# Patient Record
Sex: Male | Born: 1990 | Race: Black or African American | Hispanic: No | Marital: Single | Smoking: Never smoker
Health system: Southern US, Community
[De-identification: ages and names within clinical notes are randomized; demographics above are authoritative.]

## PROBLEM LIST (undated history)

## (undated) DIAGNOSIS — S069X9A Unspecified intracranial injury with loss of consciousness of unspecified duration, initial encounter: Secondary | ICD-10-CM

## (undated) DIAGNOSIS — G43909 Migraine, unspecified, not intractable, without status migrainosus: Secondary | ICD-10-CM

## (undated) HISTORY — PX: PEG TUBE PLACEMENT: SUR1034

## (undated) HISTORY — PX: BRONCHOSCOPY RIGID W/ PLACEMENT TRACHEAL / BRONCHIAL STENT: SUR167

## (undated) HISTORY — DX: Migraine, unspecified, not intractable, without status migrainosus: G43.909

---
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
## 2008-02-08 ENCOUNTER — Encounter
Admission: RE | Admit: 2008-02-08 | Discharge: 2008-03-15 | Payer: Self-pay | Admitting: Physical Medicine & Rehabilitation

## 2008-02-10 ENCOUNTER — Ambulatory Visit: Payer: Self-pay | Admitting: Physical Medicine & Rehabilitation

## 2008-03-15 ENCOUNTER — Ambulatory Visit: Payer: Self-pay | Admitting: Physical Medicine & Rehabilitation

## 2008-06-13 ENCOUNTER — Encounter
Admission: RE | Admit: 2008-06-13 | Discharge: 2008-09-11 | Payer: Self-pay | Admitting: Physical Medicine & Rehabilitation

## 2008-07-01 ENCOUNTER — Ambulatory Visit: Payer: Self-pay | Admitting: Physical Medicine & Rehabilitation
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
## 2009-01-06 ENCOUNTER — Encounter
Admission: RE | Admit: 2009-01-06 | Discharge: 2009-01-17 | Payer: Self-pay | Admitting: Physical Medicine & Rehabilitation

## 2009-01-11 ENCOUNTER — Ambulatory Visit: Payer: Self-pay | Admitting: Physical Medicine & Rehabilitation

## 2009-04-14 ENCOUNTER — Emergency Department (HOSPITAL_COMMUNITY): Admission: EM | Admit: 2009-04-14 | Discharge: 2009-04-14 | Payer: Self-pay | Admitting: Emergency Medicine

## 2009-04-21 ENCOUNTER — Emergency Department (HOSPITAL_COMMUNITY): Admission: EM | Admit: 2009-04-21 | Discharge: 2009-04-21 | Payer: Self-pay | Admitting: Emergency Medicine

## 2009-05-05 ENCOUNTER — Emergency Department (HOSPITAL_COMMUNITY): Admission: EM | Admit: 2009-05-05 | Discharge: 2009-05-05 | Payer: Self-pay | Admitting: Emergency Medicine

## 2010-09-02 ENCOUNTER — Encounter: Payer: Self-pay | Admitting: Physical Medicine & Rehabilitation

## 2010-12-25 NOTE — Assessment & Plan Note (Signed)
Dakota Pena is here in followup of his traumatic brain injury.  He finished  his therapy last month.  Over the last several weeks he notes increasing  depression. Family noted decreased drive and comments that he is  slower than what he was.  He denies pain today.  He is doing some  walking at home but no other specific exercises as shown by therapy.  He  is taking some classes in his school twice a week to include grammar,  [place], math, speech.  It sounds as if he is planning to go back to  school this fall.  Dakota Pena is with him today and does not know of any  specific plans that were in order for him in the fall.  I assume that  there are as he has received neuropsychological testing, but I am not  privy to the details.   REVIEW OF SYSTEMS:  Notable for the above.  He denies any pain or aches.  Full review is in the written Health and History section.   SOCIAL HISTORY:  The patient is single and lives with his father.   PHYSICAL EXAMINATION:  VITAL SIGNS:  Blood pressure 120/72, pulse 55,  respiratory rate 17.  He is saturating 98%on room air.  GENERAL:  The patient is pleasant and oriented x3 with extra time.  The  patient remains slow to shift between topics.  He had difficulty with  divided attention. His was able to sequence tasks and able to spell the  word world forward and backwards. He was able to add dates together to  come up with total date.  He had more difficulty when performing another  task, however.  He seems to have some insight to his deficits in  situations, but he remains flat and is slow to initiate overall.  The  patient walks with a steppage-type gait pattern on the left and has  difficulty clearing the right due to hyperextension at the left knee.  Strength is good in both lower extremities at 5/5, but he seems to  display decreased proprioception and coordination overall in the left  lower more than left upper extremity today.  Facial weakness is still  noticeable. Speech is slightly dysarthric but intelligible.  Voice  remains nasal and monotone.  HEART:  Regular.  CHEST:  Clear.  ABDOMEN: Soft and nontender.   IMPRESSION:  1. Severe traumatic brain injury.  2. Dysphagia.  3. Impaired vision.  4. Reactive depression.   PLAN:  1. The patient is still awaiting corrective glasses.  2. Will begin patient on Cymbalta 30 mg daily for the first week, then      60 mg thereafter.  3. Begin Concerta 18 mg daily for attention and arousal.  4. Continued schooling.  I do not see how he will be able to keep up      with mainstream classes in the fall.  I assume the school is      evaluating this and has an individual education plan set up for      him.  I have not received neuropsych testing results at this point.  5. Encourage regular exercises to improve his quality of gait.  We      discussed these exercises today.  6. I will see the patient back in about 1 month's time.      Ranelle Oyster, M.D.  Electronically Signed     ZTS/MedQ  D:  02/23/2007 11:41:09  T:  02/23/2007 17:09:47  Job #:  [id number] 

## 2010-12-25 NOTE — Assessment & Plan Note (Signed)
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  He did not  tolerate the Cymbalta as he felt more depressed as if he did not want to  go one anymore.  We stopped the Cymbalta and this improved.  He began  the trial of Concerta and did not notice any substantial benefit with  this.  His teachers had noticed that he may have been walking a bit  worse and wondered if this was secondary to the drug.  Today his school  support system is present including social work, Human resources officer, etc.  Mother is here today.  They have described a plan of patient going back  on a half day schedule with 2 classes amidst regular students.  He would  attend two 90 minute classes and take these in a typical block style  fashion apparently that they are using at this point.  They do have a  fallback plan as far as more one on one shorter length classes, etc.,  that they can assume if Mung is unable to function and tolerate in  this setting.   Herley denies any pain today.  He denies frank depression but I am not  completely sure that he is being open with me on that.  He is sleeping  fairly well.  He uses his walker for gait balance at home and abroad.   REVIEW OF SYSTEMS:  Is notable for the above.  Full review is in the  written health and history section.   SOCIAL HISTORY:  Patient is single living with his parents who are  separated.   PHYSICAL EXAMINATION:  Blood pressure 115/63, pulse 75, respiratory rate  18, O2 sat 100% on room air.  Patient is generally alert and appropriate.  His affect remains  generally flat.  He walks with a scissoring gait when he is not using  his walker.  He has fair attention and focus.  He continues to take a  long time to process and initiate tasks, verbiage, etc.  Strength  remains generally 5/5 although he has decreased proprioception and  coordination overall.  Speech is dysarthric and slow but volume is good  and he is intelligible. He continues to have some  facial weakness.  Voice is nasal in nature.  HEART: Regular.  CHEST: Clear.  ABDOMEN: Soft and nontender.   ASSESSMENT:  1. Severe traumatic brain injury.  2. Dysphagia.  3. Impaired vision.  4. Reactive depression.   PLAN:  1. Continue with current eyeglass wear as this seems to be working      well for him.  2. We will increase Concerta to 36 mg daily.  3. Spoke at length with his support team regarding his schooling.  I      am a little concerned with his ability to attend to a class in a      regular environment for 90 minutes.  This may be difficult.      Hopefully the Concerta increase will help him.  Certainly it is      good to hear that there are fallback options available for him.  4. I do not believe the Concerta is leading to any gait problems and      in fact I do not think that his gait is appreciably worse than what      it was last visit.  If his gait does worsen perceivably with      increasing this Concerta will certainly stop it.  5. We  will check CBC,CMET, cortisol, TSH and testosterone levels today      to follow up these as any source of his      cognitive slowing.  6. I will see Yasuo back in about 2 to 3 months' time.      Ranelle Oyster, M.D.  Electronically Signed     ZTS/MedQ  D:  03/25/2007 11:17:04  T:  03/26/2007 12:37:11  Job #:  [id number]

## 2010-12-25 NOTE — Assessment & Plan Note (Signed)
Dakota Pena is back regarding his brain injury.  We Botoxed his gastrocnemius  muscles on March 15, 2008.  He has had improvement in the muscle  flexibility of his ankle.  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  He is doing well with this.  He is working-out daily  with a trainer.  He denies pain.  Mood has been good.  Issues continue  to be related to volume of his speech and use of his right hand and  walking.  Dad has some concerns in these areas.   REVIEW OF SYSTEMS:  Notable for the above.  The patient denies  depression.  Sleeping well, eating well, etc.  Full review is in the  written health and history section.   SOCIAL HISTORY:  The patient is living with his parents and no problems  currently.   PHYSICAL EXAMINATION:  VITAL SIGNS:  Blood pressure is 125/71, pulse is  54, respiratory rate 18, and he is sating 100% on room air.  GENERAL:  The patient is pleasant, alert, and oriented x3.  Affect is  generally bright and appropriate.  Speech continues to be a bit  dysarthric and hypophonic but with cueing, he is better.  EXTREMITIES:  He has good strength in both upper and lower extremities  today.  He has a mild right pronator drift.  With walking, he tends to  scissor and walk with steppings to the right, but with cueing does very  well and this improves.  He uses a cane for balance.  NEUROLOGIC:  Cognitively, he is intact.  He has good insight and  awareness, etc.  He is generally much better than he was 6 months ago.   ASSESSMENT:  1. Traumatic brain injury.  2. Reactive depression.   PLAN:  We talked in depth today the issues regarding his phonation, use  of his right hand, and his walking.  I think all these areas should  improve with appropriate practice.  We talked about work on some aerobic  exercises and work on his breathing coordination to improve phonation.  He also will  benefit from using music and singing to improve his  phonation.  He needs to really apply use of his right hand as well and  not given to the left as easy.  Also we discussed the same type of thing  with his gait as far as practicing appropriate technique.  I think he  could benefit from walking in a pool or on a treadmill with a mirror to  get some visual feedback on his forearm.  Overall, I am very pleased  with Dakota Pena's progress.  I think, if he can work on these areas on a  continued basis, he should continue to improve.  I will see him back in  about 6 months for followup.      Ranelle Oyster, M.D.  Electronically Signed     ZTS/MedQ  D:  07/01/2008 14:07:13  T:  07/02/2008 02:30:00  Job #:  [id number]

## 2010-12-25 NOTE — Assessment & Plan Note (Signed)
Dakota Pena is back regarding his brain injury.  His mood has been much  improved over the last several weeks.  He saw Dr. Leonides Cave.  He felt that  his reaction was secondary to his grandmother's passing with no  underlying etiology.  He thought he possibly could benefit from  antidepressant, but considering his poor response in the past to these,  did not push that recommendation.  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  He had  braces put on a few days ago.  He denies pain today.   REVIEW OF SYSTEMS:  Notable for the above.  Full review is in the  written health and history section of the chart.   SOCIAL HISTORY:  Unchanged.  Lives with his mother and is going to  school now.   PHYSICAL EXAMINATION:  Blood pressure 128/57, pulse 69, respiratory rate  18.  He is satting 97% on room air.  Patient is pleasant, alert and  oriented x3.  The patient is much more upbeat in initiative today.  He  continues waling with a steppage gait pattern and occasional scissoring,  but seems to be doing a bit better despite wearing large work boots.  Strength is 5/5 with ongoing apraxia still more on the left than the  right.  Speech is slightly apraxic as well.  If he is weak anywhere, it  is perhaps in the left ankle dorsiflexor still with 4/5 strength.  No  tone is noted.  He had full easy passive range of motion throughout.  HEART:  Regular.  CHEST:  Clear.  ABDOMEN:  Soft, nontender.   ASSESSMENT:  1. Severe traumatic brain injury.  2. Reactive decrease in mood.   PLAN:  1. Continue with physical therapy to improve the quality of his gait.      He will need to work on this for some time after completing therapy      as well.  I asked him to focus on the quality rather than quantity      of his gait.  He might benefit from pool ambulation as well as      treadmill walking down the line.  2. I will see Joaopedro back in four months' time.  I am  pleased how he      has progressed since February.      Ranelle Oyster, M.D.  Electronically Signed     ZTS/MedQ  D:  10/21/2007 12:52:10  T:  10/21/2007 18:41:17  Job #:  [id number]
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
## 2010-12-25 NOTE — Procedures (Signed)
NAMEROSHAD, HACK NO.:  [id number]   MEDICAL RECORD NO.:  [id number]         PATIENT TYPE:  HREC   LOCATION:                                 FACILITY:   PHYSICIAN:  Ranelle Oyster, M.D.DATE OF BIRTH:  1991/03/02   DATE OF PROCEDURE:  DATE OF DISCHARGE:                               OPERATIVE REPORT   PROCEDURE:  Botox injection, diagnostic code 72.12.   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  We sought the highest areas of activity and a  central muscle belly.  After localization, we injected in each head with  100 units of botulinum toxin type A diluted in 1 mL preservative-free  normal saline.  The patient tolerated well.   Encouraged activity, stretching range of motion as he has been doing at  home.  I will see him back in 3 months.      Ranelle Oyster, M.D.  Electronically Signed     ZTS/MEDQ  D:  03/15/2008 13:11:32  T:  03/16/2008 05:09:13  Job:  [id number]
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
## 2010-12-25 NOTE — Assessment & Plan Note (Signed)
HISTORY OF PRESENT ILLNESS:  Dakota Pena is back regarding his brain injury.  He had been doing well over the last couple of months.  He graduated  from high school and walked with his class.  He signed up for classes at  [employer] for the fall.  He will take 1 or 2 courses.  He still is having difficulties with his walking.  He uses his AFO.  He  is up and about.  His cane is therefore balanced.  He is able to dress  and bathe on his own.  He is still with his parents to provide  supervision.  Mood has been generally improved.  He denies flank pain,  although he notes some popping in the right shoulder while swinging at  goal club this weekend.   REVIEW OF SYSTEMS:  Notable for tremor, trouble walking, occasional  confusion.  His full review is in the written health history section of  the chart.   SOCIAL HISTORY:  As noted above.  He lives with his mother.   PHYSICAL EXAMINATION:  VITAL SIGNS:  Blood pressure 107/51, pulse is 50,  respiratory rate 20, and sating 98% room air.  GENERAL:  The patient is pleasant, alert and oriented x3.  Affect is  generally bright today.  He is talking more than I have ever heard him  talk before with excellent language skills and better voice and flexion.  He makes more eye contact today.  Movement still are slow and apraxic.  When he walks with left foot, the toes tend to drag, and he hyperextends  the knee in weightbearing.  Brace seems to be fitting appropriately over  the leg.  In checking his range of motion, he has definite tightness in  the left heel cord with spasticity to gastrocnemius.  There is also some  trace spasticity at the hamstrings.  Right shoulder is a bit tight with  abduction.  He has a hard time getting him pass 90 degrees with  tightness.  He seemed to tight along the glenohumeral junction.  No pain  was produced.  He popped one time, but really not again.   ASSESSMENT:  1. Severe traumatic brain injury.  2.  Reactive depression.   PLAN:  1. I would like to perform Botox to the left gastrocnemius/soleus      muscles using 200 units.  2. Improve his ankle range of motion to look at placing a lift in the      left heel to prevent knee hyperextension.  3. Recommended range of motion for right shoulder at home with      different exercises we described in the office today.  4. I wrote Dakota Pena a letter for [employer] as well as the [employer] stating his deficits and current diagnosis.      Ranelle Oyster, M.D.  Electronically Signed     ZTS/MedQ  D:  02/10/2008 13:20:44  T:  02/11/2008 05:17:08  Job #:  [id number]
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
## 2010-12-28 NOTE — Assessment & Plan Note (Signed)
HISTORY OF PRESENT ILLNESS:  Dakota Pena is here in followup of his traumatic  brain injury.  He was in rehab until January 7 and now is at home  receiving therapy and home bound schooling twice a week.  His therapies  are actually at [facility].  Dakota Pena has been working  on balance and gait as well as coordination.  He is walking on a  treadmill.  His balance he notes is improving, particularly when he uses  the wide gait mechanism.  His biggest complaint today is decreased  vision, both for near and far objects, but seem to worsen during the day  as he grows fatigued.  He denies any frank double vision.  Family notes  occasional twitching of the eyeball.  Dakota Pena has only had one headaches  and does not note any headaches with the decreased vision.  Right  shoulder has been better except for when he has ranged to extreme  levels.  He has been sleeping well.  Mood has been good.  Family states  he is a bit more outspoken, but not impulsive or agitated.  All in all,  Dakota Pena has been doing quite well despite a lot of the notoriety he has  gained in the media over this incident.   REVIEW OF SYSTEMS:  Notable for the above.  The patient does have  occasional tremor, particularly in the right side.  Denies any other  Constitutional, GI, GU, Cardiorespiratory complaints.   SOCIAL HISTORY:  The patient is living with his father who provides the  majority of his care.  His mother also assists.   PHYSICAL EXAMINATION:  VITAL SIGNS:  Blood pressure 120/72, pulse 53,  respirations 16, saturating 99% on room air.  GENERAL:  The patient is pleasant, no acute distress.  He is alert and  oriented x3.  Affect is bright and generally appropriate.  HEART:  Regular. C  CHEST:  Clear.  ABDOMEN:  Soft, nontender.  NEUROLOGICAL:  Dakota Pena's speech is still nasal and somewhat muffled in  quality, but he is able to convey his thoughts and ideas.  He is talking  in longer sentences and phrases  than he had previously.  He is able to  follow one step commands without difficulty.  Insight and awareness is  actually very good.  He occasionally needed help from his mom and dad to  finish thoughts, although, often he could really finish a thought on his  own, but seemed to want to fall back on assistance by them to complete a  sentence.  Motor function is gradually improving.  His strength is  essentially 5/5 on the left and 4-4+ on the right.  The right upper  extremity still seems to be the most affected.  His fluidity has  improved however significantly there.  Was able to really lift in range  is arm and shoulder to a near full range of motion today.  Dakota Pena stood  for me today with little assistance.  The right knee tended to  hyperextend when he stood, but he was able to correct.  He needed some  queuing for standing straight, but his posture all in all was excellent,  and once he was standing, his only supervision to gentle contact guard  for support.  No tone was appreciated in the legs or arms today.  Sensation was normal.  Cranial nerve exam was generally unremarkable.  I  saw no double vision or definite disconjugate movement of the eyes  today.  The patient had two areas of redness and discoloration of the  interphalangeal joints of the third and fourth toes of the left foot.  There were no signs of fracture.   ASSESSMENT:  1. Status post severe traumatic brain injury with diffuse axonal-type      mechanism.  2. Dysphagia which has resolved.  3. Decreased vision since brain injury.   PLAN:  1. Continue with outpatient therapy.  The patient is doing quite well      at this point, but he needs to work on the fluidity of his      movement.  I would focus on repetition rather than weight and      resistance at this time.  2. Will make a referral to Dr. Aura Camps for formal visual      evaluation in the setting of his traumatic brain injury.  He is now      over  four months out from the injury itself.  3. Continue educational efforts at home, although, I do not think      forcing him along with school at this point is the best option.      This will come along on its own.  It certainly does not hurt to      introduce these areas to help push him.  However, I do not want he      and the family to be unrealistic about expectations here in the      short term.  4. I will see [name] back in about 2-3 months time.      Ranelle Oyster, M.D.  Electronically Signed     ZTS/MedQ  D:  09/15/2006 12:55:35  T:  09/15/2006 13:29:28  Job #:  [id number]   cc:   Ramon Dredge L. Juanetta Gosling, M.D.  Fax: [phone]
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
## 2010-12-28 NOTE — Assessment & Plan Note (Signed)
The patient is back regarding his TBI. He is working on outpatient  therapies. He has been discharged from speech therapy. He is walking  without a device at home and uses a walker outside the house. He is  still having some difficulties with fluidity of his movement but it is  generally improving. He is getting home schooling 6-7 hours a week,  essentially working on Doctor, general practice and math. He is receiving some more  neuro-psych testing with plans for further testing at the end of the  summer with goal of potentially returning to school. He is sleeping  well. He is eating well. He denies any mood problems. He does singing  and musical work with his dad. He told me he played the drums recently.  He is on no medications currently.   REVIEW OF SYSTEMS:  Negative other than those issues mentioned above.  Full review is in the written medical history section.   SOCIAL HISTORY:  The patient is single living with his parents.   PHYSICAL EXAM:  Blood pressure is 116/71, pulse is 49, respiratory is  16, he is sating 100% on room air. The patient walked to me without a  walker today and had slightly wide-based. He still has some difficulty  coordinating the legs, particularly the right side and is still somewhat  disjointed in movements but is very stable. He is able to change  directions without issues. He is able to compensate for obstacles around  him. He has ability to touch his nose and ear with the right thumb. He  can touch his left knee cap with his right heel with extra time.  Strength is generally near 4+-5/5 throughout. Sensory exam was intact.  Voice was slightly monotone and nasal but he is able to clearly  pronounce words. Heart was regular. Chest is clear. Abdomen is soft,  nontender.   ASSESSMENT:  1. Traumatic brain injury, diffuse zygomal type mechanism.  2. Dysphasia.  3. Impaired vision.   PLAN:  1. The patient is awaiting corrective lenses per Dr. Karleen Hampshire.  2.  Continue with exercise program. I would also work on his school      skills as much as possible. He may benefit from a stimulant to      improve attention and short term memory which are his biggest      issues. I am not sure he is able to keep up with the speed of a      main-stream class at this point. I will re-evaluate that in the      summer when I see him back. I would appreciate neuro-psych testing      results if possible.      Ranelle Oyster, M.D.  Electronically Signed     ZTS/MedQ  D:  12/01/2006 13:59:01  T:  12/01/2006 14:26:15  Job #:  [id number]   cc:   Ramon Dredge L. Juanetta Gosling, M.D.  Fax: [phone]

## 2011-01-01 ENCOUNTER — Emergency Department (HOSPITAL_COMMUNITY): Payer: Medicaid Other

## 2011-01-01 ENCOUNTER — Emergency Department (HOSPITAL_COMMUNITY)
Admission: EM | Admit: 2011-01-01 | Discharge: 2011-01-01 | Disposition: A | Discharge status: Home / Self Care | Payer: Medicaid Other | Attending: Emergency Medicine | Admitting: Emergency Medicine

## 2011-01-01 DIAGNOSIS — M7989 Other specified soft tissue disorders: Secondary | ICD-10-CM | POA: Insufficient documentation

## 2012-01-14 LAB — LIPID PANEL
Cholesterol: 184 (ref 0–200)
LDL Cholesterol: 114
Triglycerides: 38 — AB (ref 40–160)

## 2013-01-13 ENCOUNTER — Other Ambulatory Visit (HOSPITAL_COMMUNITY): Payer: Self-pay | Admitting: Pulmonary Disease

## 2013-01-13 ENCOUNTER — Ambulatory Visit (HOSPITAL_COMMUNITY)
Admission: RE | Admit: 2013-01-13 | Discharge: 2013-01-13 | Disposition: A | Discharge status: Home / Self Care | Payer: Medicaid Other | Source: Ambulatory Visit | Attending: Pulmonary Disease | Admitting: Pulmonary Disease

## 2013-01-13 DIAGNOSIS — R52 Pain, unspecified: Secondary | ICD-10-CM

## 2013-01-13 DIAGNOSIS — M25519 Pain in unspecified shoulder: Secondary | ICD-10-CM | POA: Insufficient documentation

## 2013-03-13 ENCOUNTER — Emergency Department (HOSPITAL_COMMUNITY)
Admission: EM | Admit: 2013-03-13 | Discharge: 2013-03-13 | Disposition: A | Discharge status: Home / Self Care | Payer: Medicaid Other | Attending: Emergency Medicine | Admitting: Emergency Medicine

## 2013-03-13 ENCOUNTER — Encounter (HOSPITAL_COMMUNITY): Payer: Self-pay | Admitting: Emergency Medicine

## 2013-03-13 DIAGNOSIS — Z87828 Personal history of other (healed) physical injury and trauma: Secondary | ICD-10-CM | POA: Insufficient documentation

## 2013-03-13 DIAGNOSIS — I498 Other specified cardiac arrhythmias: Secondary | ICD-10-CM | POA: Insufficient documentation

## 2013-03-13 DIAGNOSIS — L259 Unspecified contact dermatitis, unspecified cause: Secondary | ICD-10-CM | POA: Insufficient documentation

## 2013-03-13 HISTORY — DX: Unspecified intracranial injury with loss of consciousness of unspecified duration, initial encounter: S06.9X9A

## 2013-03-13 MED ORDER — PREDNISONE 10 MG PO TABS: ORAL_TABLET | ORAL | Status: DC

## 2013-03-13 MED ORDER — HYDROCODONE-ACETAMINOPHEN 5-325 MG PO TABS
2.0000 | ORAL_TABLET | Freq: Once | ORAL | Status: AC
  Administered 2013-03-13: 2 via ORAL
  Filled 2013-03-13: qty 2

## 2013-03-13 MED ORDER — CEPHALEXIN 500 MG PO CAPS
500.0000 mg | ORAL_CAPSULE | Freq: Once | ORAL | Status: AC
  Administered 2013-03-13: 500 mg via ORAL
  Filled 2013-03-13: qty 1

## 2013-03-13 MED ORDER — MUPIROCIN CALCIUM 2 % EX CREA: TOPICAL_CREAM | Freq: Three times a day (TID) | CUTANEOUS | Status: DC

## 2013-03-13 MED ORDER — HYDROCODONE-ACETAMINOPHEN 5-325 MG PO TABS: 1.0000 | ORAL_TABLET | ORAL | Status: DC | PRN

## 2013-03-13 MED ORDER — PREDNISONE 50 MG PO TABS
60.0000 mg | ORAL_TABLET | Freq: Once | ORAL | Status: AC
  Administered 2013-03-13: 60 mg via ORAL
  Filled 2013-03-13: qty 1

## 2013-03-13 NOTE — ED Provider Notes (Signed)
Medical screening examination/treatment/procedure(s) were performed by non-physician practitioner and as supervising physician I was immediately available for consultation/collaboration.   Hurman Horn, MD 03/13/13 [phone]

## 2013-03-13 NOTE — ED Notes (Signed)
Pt c/o painful rash to left axilla x 1 week.

## 2013-03-13 NOTE — ED Provider Notes (Signed)
CSN: [id number]     Arrival date & time 03/13/13  0902 History     First MD Initiated Contact with Patient 03/13/13 [phone]     Chief Complaint  Patient presents with  . Rash   (Consider location/radiation/quality/duration/timing/severity/associated sxs/prior Treatment) HPI Comments: Patient presents to the emergency department with complaint of rash on the right and left axilla. Patient states this started approximately 1-1-1/2 weeks ago. Patient states he has" very sensitive skin" and has recently tried a different deodorant. Shortly after using his deodorant he noted painful sores under the arms. He then developed blisters which gradually ruptured, he states he noticed a change in the color of his arm, and this was accompanied by a great deal of pain. He's not had any puslike drainage from the areas under the arm. He's not had any nausea vomiting. The patient denies having any immune compromising situations.  The history is provided by the patient.    Past Medical History  Diagnosis Date  . TBI (traumatic brain injury)    History reviewed. No pertinent past surgical history. No family history on file. History  Substance Use Topics  . Smoking status: Never Smoker   . Smokeless tobacco: Not on file  . Alcohol Use: No    Review of Systems  Constitutional: Negative for activity change.       All ROS Neg except as noted in HPI  HENT: Negative for nosebleeds and neck pain.   Eyes: Negative for photophobia and discharge.  Respiratory: Negative for cough, shortness of breath and wheezing.   Cardiovascular: Negative for chest pain and palpitations.  Gastrointestinal: Negative for abdominal pain and blood in stool.  Genitourinary: Negative for dysuria, frequency and hematuria.  Musculoskeletal: Negative for back pain and arthralgias.  Skin: Positive for rash.  Neurological: Negative for dizziness, seizures and speech difficulty.  Psychiatric/Behavioral: Negative for hallucinations and  confusion.    Allergies  Review of patient's allergies indicates no known allergies.  Home Medications  No current outpatient prescriptions on file. BP 131/88  Pulse 50  Temp(Src) 99 F (37.2 C)  Resp 18  Ht 6\' 3"  (1.905 m)  Wt 190 lb (86.183 kg)  BMI 23.75 kg/m2  SpO2 99% Physical Exam  Nursing note and vitals reviewed. Constitutional: He is oriented to person, place, and time. He appears well-developed and well-nourished.  Non-toxic appearance.  HENT:  Head: Normocephalic.  Right Ear: Tympanic membrane and external ear normal.  Left Ear: Tympanic membrane and external ear normal.  Eyes: EOM and lids are normal. Pupils are equal, round, and reactive to light.  Neck: Normal range of motion. Neck supple. Carotid bruit is not present.  Cardiovascular: Regular rhythm, normal heart sounds, intact distal pulses and normal pulses.  Bradycardia present.   Pulmonary/Chest: Breath sounds normal. No respiratory distress.  Abdominal: Soft. Bowel sounds are normal. There is no tenderness. There is no guarding.  Musculoskeletal: Normal range of motion.  Lymphadenopathy:       Head (right side): No submandibular adenopathy present.       Head (left side): No submandibular adenopathy present.    He has no cervical adenopathy.  Neurological: He is alert and oriented to person, place, and time. He has normal strength. No cranial nerve deficit or sensory deficit.  Skin: Skin is warm and dry. Rash noted.  There multiple ruptured blisters under the right and left axilla. There is hyperpigmentation of the area of both axilla areas. There is some drying and scaling areas under the axilla.  There no red streaking appreciated. There is no drainage at this time. Is no palpable lymphadenopathy appreciated.  Psychiatric: He has a normal mood and affect. His speech is normal.    ED Course   Procedures (including critical care time)  Labs Reviewed - No data to display No results found. No diagnosis  found.  MDM  *I have reviewed nursing notes, vital signs, and all appropriate lab and imaging results for this patient.** Patient has started a different deodorant. He now has multiple ruptured blister areas under the arm on the right and left. He also has pain and discomfort. There is no red streaking appreciated. There is no active drainage at this time. The area is tender to palpation.  Patient advised to stop the deodorant. To try hypoallergenic deodorants. Prescription for Bactroban, prednisone, and Norco given to the patient.  Kathie Dike, PA-C 03/13/13 1050

## 2018-09-25 ENCOUNTER — Emergency Department (HOSPITAL_COMMUNITY): Payer: Medicaid Other

## 2018-09-25 ENCOUNTER — Other Ambulatory Visit: Payer: Self-pay

## 2018-09-25 ENCOUNTER — Emergency Department (HOSPITAL_COMMUNITY)
Admission: EM | Admit: 2018-09-25 | Discharge: 2018-09-25 | Disposition: A | Discharge status: Home / Self Care | Payer: Medicaid Other | Attending: Emergency Medicine | Admitting: Emergency Medicine

## 2018-09-25 ENCOUNTER — Encounter (HOSPITAL_COMMUNITY): Payer: Self-pay | Admitting: Emergency Medicine

## 2018-09-25 DIAGNOSIS — Z79899 Other long term (current) drug therapy: Secondary | ICD-10-CM | POA: Insufficient documentation

## 2018-09-25 DIAGNOSIS — R1012 Left upper quadrant pain: Secondary | ICD-10-CM | POA: Diagnosis present

## 2018-09-25 DIAGNOSIS — J209 Acute bronchitis, unspecified: Secondary | ICD-10-CM | POA: Insufficient documentation

## 2018-09-25 DIAGNOSIS — J40 Bronchitis, not specified as acute or chronic: Secondary | ICD-10-CM

## 2018-09-25 MED ORDER — BENZONATATE 200 MG PO CAPS: 200.0000 mg | ORAL_CAPSULE | Freq: Three times a day (TID) | ORAL | 0 refills | Status: DC | PRN

## 2018-09-25 MED ORDER — AZITHROMYCIN 250 MG PO TABS
ORAL_TABLET | ORAL | 0 refills | Status: DC
Start: 2018-09-25 — End: 2019-05-11

## 2018-09-25 MED ORDER — IBUPROFEN 800 MG PO TABS
800.0000 mg | ORAL_TABLET | Freq: Once | ORAL | Status: AC
  Administered 2018-09-25: 800 mg via ORAL
  Filled 2018-09-25: qty 1

## 2018-09-25 NOTE — ED Notes (Signed)
Pt reports working out several days ago and had back pain   Has come here for eval   Denies incontinence   Ambulates heel to toe

## 2018-09-25 NOTE — ED Notes (Signed)
TT in to assess 

## 2018-09-25 NOTE — ED Triage Notes (Signed)
Pain in upper RT side of back when breathing for a few days.  Reports having a cold for a week or 2 with a lot of coughing.

## 2018-09-25 NOTE — ED Provider Notes (Signed)
[facility] EMERGENCY DEPARTMENT Provider Note   CSN: [id number] Arrival date & time: 09/25/18  1508     History   Chief Complaint Chief Complaint  Patient presents with  . Back Pain    HPI Dakota Pena is a 28 y.o. male.  HPI  Dakota Pena is a 28 y.o. male who presents to the Emergency Department complaining of left upper back pain, cough, chills, and intermittent fever.  Symptoms have been present for greater than 1 week.  He states the cough has been excessive at times and occasionally productive.  He notices pain to his left upper back with coughing, deep breathing and sneezing.  He does not have pain with movement of his left arm or with palpation.  He also states that he was working out with weights recently and thought maybe he may have injured his upper back.  He denies shortness of breath and chest pain.  No sore throat, abdominal pain, or vomiting.  He has not tried any medication for symptomatic relief.    Past Medical History:  Diagnosis Date  . TBI (traumatic brain injury) (HCC)     There are no active problems to display for this patient.   History reviewed. No pertinent surgical history.    Home Medications    Prior to Admission medications   Medication Sig Start Date End Date Taking? Authorizing Provider  HYDROcodone-acetaminophen (NORCO/VICODIN) 5-325 MG per tablet Take 1 tablet by mouth every 4 (four) hours as needed for pain. 03/13/13   Ivery Quale, PA-C  mupirocin cream (BACTROBAN) 2 % Apply topically 3 (three) times daily. 03/13/13   Ivery Quale, PA-C  predniSONE (DELTASONE) 10 MG tablet 3,9,0,3,0 - take daily with food starting Aug. 3, 2014 03/13/13   Ivery Quale, PA-C    Family History No family history on file.  Social History Social History   Tobacco Use  . Smoking status: Never Smoker  . Smokeless tobacco: Never Used  Substance Use Topics  . Alcohol use: No  . Drug use: No     Allergies   Patient has no known  allergies.   Review of Systems Review of Systems  Constitutional: Positive for chills and fever. Negative for appetite change.  HENT: Positive for congestion. Negative for sore throat and trouble swallowing.   Respiratory: Positive for cough and chest tightness. Negative for shortness of breath and wheezing.   Cardiovascular: Negative for chest pain and leg swelling.  Gastrointestinal: Negative for abdominal pain, nausea and vomiting.  Genitourinary: Negative for decreased urine volume and dysuria.  Musculoskeletal: Positive for back pain (Left upper back pain). Negative for arthralgias.  Skin: Negative for rash.  Neurological: Negative for dizziness, weakness, numbness and headaches.  Hematological: Negative for adenopathy.     Physical Exam Updated Vital Signs BP 128/85 (BP Location: Right Arm)   Pulse 93   Temp 99.9 F (37.7 C) (Oral)   Resp 18   Ht 6\' 3"  (1.905 m)   Wt 81.6 kg   SpO2 97%   BMI 22.50 kg/m   Physical Exam Vitals signs and nursing note reviewed.  Constitutional:      Appearance: Normal appearance.  HENT:     Head: Normocephalic.     Right Ear: Tympanic membrane and ear canal normal.     Left Ear: Tympanic membrane and ear canal normal.     Mouth/Throat:     Mouth: Mucous membranes are moist.     Pharynx: Oropharynx is clear. No oropharyngeal exudate or  posterior oropharyngeal erythema.  Cardiovascular:     Rate and Rhythm: Normal rate and regular rhythm.     Pulses: Normal pulses.  Pulmonary:     Effort: Pulmonary effort is normal.     Breath sounds: Normal breath sounds.  Abdominal:     General: There is no distension.     Palpations: Abdomen is soft.     Tenderness: There is no abdominal tenderness.  Musculoskeletal: Normal range of motion.        General: No swelling or deformity.     Comments: Pt points to left scapular border as area of pain.  No tenderness to palpation, no edema.  No pain on ROM of the left arm.    Lymphadenopathy:      Cervical: No cervical adenopathy.  Skin:    General: Skin is warm.     Capillary Refill: Capillary refill takes less than 2 seconds.     Findings: No rash.  Neurological:     Mental Status: He is alert. Mental status is at baseline.      ED Treatments / Results  Labs (all labs ordered are listed, but only abnormal results are displayed) Labs Reviewed - No data to display  EKG None  Radiology No results found.  Procedures Procedures (including critical care time)  Medications Ordered in ED Medications  ibuprofen (ADVIL,MOTRIN) tablet 800 mg (800 mg Oral Given 09/25/18 1613)     Initial Impression / Assessment and Plan / ED Course  I have reviewed the triage vital signs and the nursing notes.  Pertinent labs & imaging results that were available during my care of the patient were reviewed by me and considered in my medical decision making (see chart for details).      Pt is well appearing.  Vitals are reassuring.  No tachycardia, hypoxia or dyspnea. CXR is neg for PNA.  Clinically, I doubt PE or dissection.    Pt has had sx's for greater than one week, so I feel that a course of antibiotics would be reasonable. Pt agrees to tx plan and close out pt f/u.  Return precautions discussed.    Final Clinical Impressions(s) / ED Diagnoses   Final diagnoses:  Bronchitis    ED Discharge Orders    None       Pauline Aus, PA-C 09/25/18 1706    Bethann Berkshire, MD 09/28/18 1510

## 2018-09-25 NOTE — Discharge Instructions (Addendum)
Drink plenty of fluids.  Take tylenol or ibuprofen if needed for pain or fever.  Take the antibiotic as directed until its finished.  Follow-up with your doctor for recheck or return to ER for any worsening symptoms

## 2018-09-25 NOTE — ED Notes (Signed)
From Rad 

## 2019-04-14 ENCOUNTER — Other Ambulatory Visit (HOSPITAL_COMMUNITY)
Admission: RE | Admit: 2019-04-14 | Discharge: 2019-04-14 | Disposition: A | Discharge status: Home / Self Care | Payer: Medicaid Other | Source: Ambulatory Visit | Attending: Pulmonary Disease | Admitting: Pulmonary Disease

## 2019-04-14 ENCOUNTER — Other Ambulatory Visit: Payer: Self-pay

## 2019-04-14 DIAGNOSIS — M25562 Pain in left knee: Secondary | ICD-10-CM | POA: Insufficient documentation

## 2019-04-14 DIAGNOSIS — Z0000 Encounter for general adult medical examination without abnormal findings: Secondary | ICD-10-CM | POA: Insufficient documentation

## 2019-04-14 DIAGNOSIS — M79672 Pain in left foot: Secondary | ICD-10-CM | POA: Diagnosis present

## 2019-04-14 DIAGNOSIS — R202 Paresthesia of skin: Secondary | ICD-10-CM | POA: Insufficient documentation

## 2019-04-14 DIAGNOSIS — G8194 Hemiplegia, unspecified affecting left nondominant side: Secondary | ICD-10-CM | POA: Diagnosis present

## 2019-04-14 DIAGNOSIS — F0781 Postconcussional syndrome: Secondary | ICD-10-CM | POA: Insufficient documentation

## 2019-04-14 DIAGNOSIS — Z87820 Personal history of traumatic brain injury: Secondary | ICD-10-CM | POA: Diagnosis not present

## 2019-04-14 LAB — TSH: TSH: 1.289 u[IU]/mL (ref 0.350–4.500)

## 2019-04-14 LAB — CBC WITH DIFFERENTIAL/PLATELET
Abs Immature Granulocytes: 0.01 10*3/uL (ref 0.00–0.07)
Basophils Absolute: 0 10*3/uL (ref 0.0–0.1)
Basophils Relative: 0 %
Eosinophils Absolute: 0.5 10*3/uL (ref 0.0–0.5)
Eosinophils Relative: 9 %
HCT: 42.5 % (ref 39.0–52.0)
Hemoglobin: 13.4 g/dL (ref 13.0–17.0)
Immature Granulocytes: 0 %
Lymphocytes Relative: 26 %
Lymphs Abs: 1.4 10*3/uL (ref 0.7–4.0)
MCH: 26.2 pg (ref 26.0–34.0)
MCHC: 31.5 g/dL (ref 30.0–36.0)
MCV: 83.2 fL (ref 80.0–100.0)
Monocytes Absolute: 0.4 10*3/uL (ref 0.1–1.0)
Monocytes Relative: 8 %
Neutro Abs: 3.2 10*3/uL (ref 1.7–7.7)
Neutrophils Relative %: 57 %
Platelets: 256 10*3/uL (ref 150–400)
RBC: 5.11 MIL/uL (ref 4.22–5.81)
RDW: 14.5 % (ref 11.5–15.5)
WBC: 5.6 10*3/uL (ref 4.0–10.5)
nRBC: 0 % (ref 0.0–0.2)

## 2019-04-14 LAB — LIPID PANEL
Cholesterol: 181 mg/dL (ref 0–200)
HDL: 68 mg/dL (ref >40)
LDL Cholesterol: 107 mg/dL — ABNORMAL HIGH (ref 0–99)
Total CHOL/HDL Ratio: 2.7 RATIO
Triglycerides: 32 mg/dL (ref <150)
VLDL: 6 mg/dL (ref 0–40)

## 2019-04-14 LAB — COMPREHENSIVE METABOLIC PANEL
ALT: 39 U/L (ref 0–44)
AST: 43 U/L — ABNORMAL HIGH (ref 15–41)
Albumin: 4.6 g/dL (ref 3.5–5.0)
Alkaline Phosphatase: 90 U/L (ref 38–126)
Anion gap: 9 (ref 5–15)
BUN: 16 mg/dL (ref 6–20)
CO2: 26 mmol/L (ref 22–32)
Calcium: 9.6 mg/dL (ref 8.9–10.3)
Chloride: 105 mmol/L (ref 98–111)
Creatinine, Ser: 0.9 mg/dL (ref 0.61–1.24)
GFR calc Af Amer: >60 mL/min (ref >60)
GFR calc non Af Amer: >60 mL/min (ref >60)
Glucose, Bld: 101 mg/dL — ABNORMAL HIGH (ref 70–99)
Potassium: 4.2 mmol/L (ref 3.5–5.1)
Sodium: 140 mmol/L (ref 135–145)
Total Bilirubin: 1.1 mg/dL (ref 0.3–1.2)
Total Protein: 7.8 g/dL (ref 6.5–8.1)

## 2019-05-11 ENCOUNTER — Other Ambulatory Visit: Payer: Self-pay

## 2019-05-11 ENCOUNTER — Encounter: Payer: Self-pay | Admitting: Family Medicine

## 2019-05-11 ENCOUNTER — Ambulatory Visit (INDEPENDENT_AMBULATORY_CARE_PROVIDER_SITE_OTHER): Payer: Medicaid Other | Admitting: Family Medicine

## 2019-05-11 VITALS — BP 137/81 | HR 52 | Temp 97.9°F | Ht 75.0 in | Wt 185.8 lb

## 2019-05-11 DIAGNOSIS — R51 Headache: Secondary | ICD-10-CM

## 2019-05-11 DIAGNOSIS — S069X9S Unspecified intracranial injury with loss of consciousness of unspecified duration, sequela: Secondary | ICD-10-CM | POA: Diagnosis not present

## 2019-05-11 DIAGNOSIS — Z23 Encounter for immunization: Secondary | ICD-10-CM
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
## 2019-05-13 ENCOUNTER — Other Ambulatory Visit: Payer: Self-pay | Admitting: *Deleted

## 2019-05-13 DIAGNOSIS — Z20822 Contact with and (suspected) exposure to covid-19: Secondary | ICD-10-CM

## 2019-05-13 DIAGNOSIS — S069X9A Unspecified intracranial injury with loss of consciousness of unspecified duration, initial encounter: Secondary | ICD-10-CM | POA: Insufficient documentation

## 2019-05-13 DIAGNOSIS — Z23 Encounter for immunization: Secondary | ICD-10-CM | POA: Insufficient documentation

## 2019-05-14 LAB — NOVEL CORONAVIRUS, NAA: SARS-CoV-2, NAA: NOT DETECTED

## 2019-05-21 DIAGNOSIS — G8194 Hemiplegia, unspecified affecting left nondominant side: Secondary | ICD-10-CM | POA: Insufficient documentation

## 2019-05-21 DIAGNOSIS — F0781 Postconcussional syndrome: Secondary | ICD-10-CM

## 2019-05-21 DIAGNOSIS — J309 Allergic rhinitis, unspecified: Secondary | ICD-10-CM | POA: Insufficient documentation

## 2019-05-21 DIAGNOSIS — R000 Tachycardia, unspecified: Secondary | ICD-10-CM

## 2019-09-03 ENCOUNTER — Other Ambulatory Visit: Payer: Self-pay

## 2019-09-03 ENCOUNTER — Ambulatory Visit: Payer: Medicaid Other | Attending: Internal Medicine

## 2019-09-03 DIAGNOSIS — Z20822 Contact with and (suspected) exposure to covid-19: Secondary | ICD-10-CM

## 2019-09-04 LAB — NOVEL CORONAVIRUS, NAA: SARS-CoV-2, NAA: DETECTED — AB

## 2019-11-17 ENCOUNTER — Other Ambulatory Visit: Payer: Self-pay

## 2019-11-17 ENCOUNTER — Encounter: Payer: Self-pay | Admitting: Family Medicine

## 2019-11-17 ENCOUNTER — Ambulatory Visit (INDEPENDENT_AMBULATORY_CARE_PROVIDER_SITE_OTHER): Payer: Medicaid Other | Admitting: Family Medicine

## 2019-11-17 VITALS — BP 132/82 | HR 75 | Temp 97.6°F | Ht 75.0 in | Wt 187.4 lb

## 2019-11-17 DIAGNOSIS — S069X9S Unspecified intracranial injury with loss of consciousness of unspecified duration, sequela: Secondary | ICD-10-CM

## 2019-11-17 DIAGNOSIS — R2689 Other abnormalities of gait and mobility: Secondary | ICD-10-CM | POA: Diagnosis not present

## 2019-11-17 DIAGNOSIS — F0781 Postconcussional syndrome: Secondary | ICD-10-CM
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
## 2019-11-17 NOTE — Patient Instructions (Addendum)
  Neuro referral PM and R   If you have lab work done today you will be contacted with your lab results within the next 2 weeks.  If you have not heard from [place] then please contact us. The fastest way to get your results is to register for My Chart.   IF you received an x-ray today, you will receive an invoice from [facility]. Please contact [facility] at [phone] with questions or concerns regarding your invoice.   IF you received labwork today, you will receive an invoice from [facility]. Please contact [facility] at [phone] with questions or concerns regarding your invoice.   Our billing staff will not be able to assist you with questions regarding bills from these companies.  You will be contacted with the lab results as soon as they are available. The fastest way to get your results is to activate your My Chart account. Instructions are located on the last page of this paperwork. If you have not heard from [place] regarding the results in 2 weeks, please contact this office.

## 2019-12-06 ENCOUNTER — Other Ambulatory Visit: Payer: Self-pay

## 2019-12-06 ENCOUNTER — Ambulatory Visit (HOSPITAL_COMMUNITY): Payer: Medicaid Other | Attending: Family Medicine | Admitting: Physical Therapy

## 2019-12-06 DIAGNOSIS — R262 Difficulty in walking, not elsewhere classified: Secondary | ICD-10-CM | POA: Insufficient documentation

## 2019-12-06 DIAGNOSIS — R2689 Other abnormalities of gait and mobility: Secondary | ICD-10-CM | POA: Diagnosis present
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
## 2019-12-09 DIAGNOSIS — Z23 Encounter for immunization: Secondary | ICD-10-CM | POA: Diagnosis not present

## 2019-12-15 ENCOUNTER — Other Ambulatory Visit: Payer: Self-pay

## 2019-12-15 ENCOUNTER — Encounter (HOSPITAL_COMMUNITY): Payer: Self-pay | Admitting: Physical Therapy

## 2019-12-15 ENCOUNTER — Ambulatory Visit (HOSPITAL_COMMUNITY): Payer: Medicaid Other | Attending: Family Medicine | Admitting: Physical Therapy

## 2019-12-15 DIAGNOSIS — R2689 Other abnormalities of gait and mobility: Secondary | ICD-10-CM | POA: Diagnosis present

## 2019-12-15 DIAGNOSIS — R262 Difficulty in walking, not elsewhere classified: Secondary | ICD-10-CM | POA: Insufficient documentation

## 2019-12-15 NOTE — Therapy (Signed)
[facility] [address] [place], [place], [address] Phone: [phone]   Fax:  [phone]  Physical Therapy Treatment  Patient Details  Name: Dakota Pena MRN: [id number] Date of Birth: 03/08/1991 Referring Provider (PT): Dorette Grate   Encounter Date: 12/15/2019  PT End of Session - 12/15/19 1040    Visit Number  2    Number of Visits  12    Date for PT Re-Evaluation  01/24/20   eval 4/26   Authorization Type  Medicaid    Authorization Time Period  3 visits approved 4/27 - 5/10    Authorization - Visit Number  1    Authorization - Number of Visits  3    PT Start Time  1032    PT Stop Time  1112    PT Time Calculation (min)  40 min    Activity Tolerance  Patient tolerated treatment well    Behavior During Therapy  [facility] for tasks assessed/performed       Past Medical History:  Diagnosis Date  . TBI (traumatic brain injury) [facility])     Past Surgical History:  Procedure Laterality Date  . BRONCHOSCOPY RIGID W/ PLACEMENT TRACHEAL / BRONCHIAL STENT    . PEG TUBE PLACEMENT      There were no vitals filed for this visit.  Subjective Assessment - 12/15/19 1028    Subjective  Patient reports he has not had any falls recently. His exercises have been going well and he has been trying to do them every day.    Pertinent History  TBI 2007    Limitations  Walking;Standing    Patient Stated Goals  to improve balance    Currently in Pain?  No/denies         [facility] PT Assessment - 12/15/19 0001      ROM / Strength   AROM / PROM / Strength  Strength      Strength   Strength Assessment Site  Hip;Knee;Ankle    Right/Left Hip  Right;Left    Right Hip Flexion  4+/5    Right Hip Extension  5/5    Right Hip ABduction  5/5    Left Hip Flexion  4+/5    Left Hip Extension  5/5    Left Hip ABduction  5/5    Right/Left Knee  Right;Left    Right Knee Flexion  5/5    Right Knee Extension  5/5    Left Knee Flexion  5/5    Left Knee  Extension  5/5    Right/Left Ankle  Right;Left    Right Ankle Dorsiflexion  5/5    Left Ankle Dorsiflexion  5/5                   [facility] Adult PT Treatment/Exercise - 12/15/19 0001      Exercises   Exercises  Knee/Hip      Knee/Hip Exercises: Standing   Lateral Step Up  Both;2 sets;10 reps;Hand Hold: 0;Step Height: 6"    Forward Step Up  2 sets;10 reps;Hand Hold: 0;Step Height: 6";Both    SLS  L 22 seconds; R 30+ seconds    SLS with Vectors  5x 5 second holds bilateral    Other Standing Knee Exercises  balance: narrow 30 seconds EO, 30 sec EC; tandem 2x30 seconds bilateral; narrow BOS on foam: EO/EC 2x30 seconds             PT Education - 12/15/19 1034  Education Details  Patient educated on continuing HEP    Person(s) Educated  Patient    Methods  Explanation;Demonstration    Comprehension  Verbalized understanding;Returned demonstration       PT Short Term Goals - 12/15/19 1107      PT SHORT TERM GOAL #1   Title  Patient will report at least 25% improvement in overall symptoms and/or functional ability.    Time  3    Period  Weeks    Status  On-going    Target Date  12/27/19      PT SHORT TERM GOAL #2   Title  Patient will be independent in self management strategies to improve quality of life and functional outcomes.    Time  3    Period  Weeks    Status  On-going    Target Date  12/27/19        PT Long Term Goals - 12/15/19 1108      PT LONG TERM GOAL #1   Title  Patient will report at least 50% improvement in overall symptoms and/or functional ability.    Time  6    Period  Weeks    Status  On-going      PT LONG TERM GOAL #2   Title  Patient will be able to stand on foam in tandem for 30 seconds without UE support to demonstrate improved static balance.    Time  6    Period  Weeks    Status  On-going      PT LONG TERM GOAL #4   Title  Patient will be able to stand on either leg for at least 40 seocnds without UE support to  demonstrate improved static balance.    Time  6    Period  Weeks    Status  On-going            Plan - 12/15/19 1105    Clinical Impression Statement  Patient demonstrates good LE strength with MMT today without c/o difficulty. He demonstrates good static balance with narrow BOS without sway with eyes open but min/mod sway with eyes closed. He requires intermittent UE support with tandem stance and demonstrates min/mod unsteadiness. He has most difficulty with SLS with vectors and requires intermittent LE/UE support for balance. He requires verbal cueing for slow and controlled stair exercise. He demonstrates impaired LE motor control with placing and removing foot from step. Patient will continue to benefit from skilled physical therapy in order to improve function and reduce impairment.    Personal Factors and Comorbidities  Age;Comorbidity 1    Comorbidities  TBI 2007    Examination-Activity Limitations  Locomotion Level;Stand    Rehab Potential  Good    PT Frequency  2x / week    PT Duration  --   6 weeks starting next week to allow for medicaid auth- 7 weeks from today   PT Treatment/Interventions  ADLs/Self Care Home Management;Aquatic Therapy;Biofeedback;Cryotherapy;Electrical Stimulation;Moist Heat;Traction;Balance training;Therapeutic exercise;Therapeutic activities;Functional mobility training;Stair training;Gait training;DME Instruction;Neuromuscular re-education;Patient/family education;Manual techniques;Passive range of motion    PT Next Visit Plan  Static balance and functional strengthening    PT Home Exercise Plan  4/26 SLS 5/5 sls with vectors, forward and lateral step ups    Consulted and Agree with Plan of Care  Patient       Patient will benefit from skilled therapeutic intervention in order to improve the following deficits and impairments:  Abnormal gait, Decreased balance, Decreased mobility, Decreased activity  tolerance, Decreased range of motion  Visit  Diagnosis: Difficulty in walking, not elsewhere classified  Balance problems     Problem List Patient Active Problem List   Diagnosis Date Noted  . Hemiplegia affecting left nondominant side ([place]) 05/21/2019  . 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  . Tachycardia 05/21/2019  . Allergic rhinitis due to allergen 05/21/2019  . Need for immunization against influenza 05/13/2019  . Brain injury with loss of consciousness ([place]) 05/13/2019    11:17 AM, 12/15/19 Mearl Latin PT, DPT Physical Therapist at [place], [place], [address] Phone: [phone]   Fax:  [phone]  Name: Dakota Pena MRN: [id number] Date of Birth: 1991/07/20

## 2019-12-17 ENCOUNTER — Other Ambulatory Visit: Payer: Self-pay

## 2019-12-17 ENCOUNTER — Encounter (HOSPITAL_COMMUNITY): Payer: Self-pay

## 2019-12-17 ENCOUNTER — Ambulatory Visit (HOSPITAL_COMMUNITY): Payer: Medicaid Other

## 2019-12-17 DIAGNOSIS — R2689 Other abnormalities of gait and mobility: Secondary | ICD-10-CM

## 2019-12-17 DIAGNOSIS — R262 Difficulty in walking, not elsewhere classified: Secondary | ICD-10-CM | POA: Diagnosis not present

## 2019-12-17 NOTE — Therapy (Signed)
[place], [place], [address] Phone: [phone]   Fax:  [phone]  Physical Therapy Treatment  Patient Details  Name: Dakota Pena MRN: [id number] Date of Birth: [birth year] Referring Provider (PT): Benny Lennert   Encounter Date: 12/17/2019  PT End of Session - 12/17/19 0850    Visit Number  3    Number of Visits  12    Date for PT Re-Evaluation  01/24/20   eval 4/26   Authorization Type  Medicaid    Authorization Time Period  3 visits approved 4/27 - 5/10    Authorization - Visit Number  2    Authorization - Number of Visits  3    PT Start Time  0825    PT Stop Time  0912    PT Time Calculation (min)  47 min    Activity Tolerance  Patient tolerated treatment well    Behavior During Therapy  [facility] for tasks assessed/performed       Past Medical History:  Diagnosis Date  . TBI (traumatic brain injury) [facility])     Past Surgical History:  Procedure Laterality Date  . BRONCHOSCOPY RIGID W/ PLACEMENT TRACHEAL / BRONCHIAL STENT    . PEG TUBE PLACEMENT      There were no vitals filed for this visit.  Subjective Assessment - 12/17/19 0827    Subjective  Pt stated he is feeling good today.  No reports of pain today.  Has been compliant wiht HEP                       [facility] Adult PT Treatment/Exercise - 12/17/19 0001      Exercises   Exercises  Knee/Hip      Knee/Hip Exercises: Standing   Heel Raises  20 reps    Heel Raises Limitations  toe raises on slant    Lateral Step Up  Both;10 reps;Hand Hold: 0;Step Height: 6"    Forward Step Up  Both;10 reps    SLS with Vectors  5x 5 second holds bilateral    Gait Training  279ft Lt knee hyperextension, Lt foot drop and scissored gait mechanics noted     Other Standing Knee Exercises  tandem stance on foam 3x 30 intermittent HHA    Other Standing Knee Exercises  toe tapping 5x individual then multiple without touching down              PT Education - 12/17/19 0837    Education Details  Pt educated on brain injury support group in the area    Person(s) Educated  Patient    Methods  Explanation    Comprehension  Verbalized understanding       PT Short Term Goals - 12/17/19 0831      PT SHORT TERM GOAL #1   Title  Patient will report at least 25% improvement in overall symptoms and/or functional ability.    Baseline  12/17/19:  pt stated he feels its too early to tell as he's had 1 tx session since initial eval    Status  On-going      PT SHORT TERM GOAL #2   Title  Patient will be independent in self management strategies to improve quality of life and functional outcomes.    Status  On-going        PT Long Term Goals - 12/15/19 1108      PT LONG TERM GOAL #1   Title  Patient will report at least 50% improvement in overall symptoms and/or functional ability.    Time  6    Period  Weeks    Status  On-going      PT LONG TERM GOAL #2   Title  Patient will be able to stand on foam in tandem for 30 seconds without UE support to demonstrate improved static balance.    Time  6    Period  Weeks    Status  On-going      PT LONG TERM GOAL #4   Title  Patient will be able to stand on either leg for at least 40 seocnds without UE support to demonstrate improved static balance.    Time  6    Period  Weeks    Status  On-going            Plan - 12/17/19 1223    Clinical Impression Statement  Session focus on static balance and LE strengthening to improve gait mechaincs.  Added cone tapping with increased difficulty with Lt SLS and increased diffuctly wiht vector stance mechanics.  Pt required verbal cueing for slow and controlled movements through session.    Personal Factors and Comorbidities  Age;Comorbidity 1    Comorbidities  TBI 2007    Examination-Activity Limitations  Locomotion Level;Stand    Stability/Clinical Decision Making  Stable/Uncomplicated    Clinical Decision Making  Low     Rehab Potential  Good    PT Frequency  2x / week    PT Duration  --   6 weeks starting next week to allow for medicaid auth- 7 weeks from today   PT Treatment/Interventions  ADLs/Self Care Home Management;Aquatic Therapy;Biofeedback;Cryotherapy;Electrical Stimulation;Moist Heat;Traction;Balance training;Therapeutic exercise;Therapeutic activities;Functional mobility training;Stair training;Gait training;DME Instruction;Neuromuscular re-education;Patient/family education;Manual techniques;Passive range of motion    PT Next Visit Plan  Static balance and functional strengthening    PT Home Exercise Plan  4/26 SLS 5/5 sls with vectors, forward and lateral step ups       Patient will benefit from skilled therapeutic intervention in order to improve the following deficits and impairments:  Abnormal gait, Decreased balance, Decreased mobility, Decreased activity tolerance, Decreased range of motion  Visit Diagnosis: Difficulty in walking, not elsewhere classified  Balance problems     Problem List Patient Active Problem List   Diagnosis Date Noted  . Hemiplegia affecting left nondominant side ([facility]) 05/21/2019  . 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  . Tachycardia 05/21/2019  . Allergic rhinitis due to allergen 05/21/2019  . Need for immunization against influenza 05/13/2019  . Brain injury with loss of consciousness ([facility]) 05/13/2019   Becky Sax, LPTA/CLT; CBIS [phone]  Juel Burrow 12/17/2019, 1:14 PM  [facility] [address] [place], [place], [address] Phone: [phone]   Fax:  [phone]  Name: MAYO FAULK MRN: [id number] Date of Birth: [birth year]

## 2019-12-20 ENCOUNTER — Ambulatory Visit (HOSPITAL_COMMUNITY): Payer: Medicaid Other | Admitting: Physical Therapy

## 2019-12-20 ENCOUNTER — Other Ambulatory Visit: Payer: Self-pay

## 2019-12-20 ENCOUNTER — Encounter (HOSPITAL_COMMUNITY): Payer: Self-pay | Admitting: Physical Therapy

## 2019-12-20 DIAGNOSIS — R262 Difficulty in walking, not elsewhere classified: Secondary | ICD-10-CM | POA: Diagnosis not present

## 2019-12-20 DIAGNOSIS — R2689 Other abnormalities of gait and mobility: Secondary | ICD-10-CM
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
## 2019-12-27 ENCOUNTER — Telehealth: Payer: Self-pay

## 2019-12-27 ENCOUNTER — Telehealth: Payer: Self-pay | Admitting: Diagnostic Neuroimaging

## 2019-12-27 ENCOUNTER — Other Ambulatory Visit: Payer: Self-pay

## 2019-12-27 ENCOUNTER — Encounter: Payer: Self-pay | Admitting: Diagnostic Neuroimaging

## 2019-12-27 ENCOUNTER — Ambulatory Visit: Payer: Medicaid Other | Admitting: Diagnostic Neuroimaging

## 2019-12-27 VITALS — BP 116/83 | HR 62 | Temp 97.7°F | Ht 75.0 in | Wt 193.0 lb

## 2019-12-27 DIAGNOSIS — G44329 Chronic post-traumatic headache, not intractable: Secondary | ICD-10-CM | POA: Diagnosis not present

## 2019-12-27 NOTE — Patient Instructions (Signed)
  TBI / POST-CONCUSSION SYNDROME / HEADACHES  - worsening headaches in 2021; post-covid infx; will check MRI brain  - continue OTC tylenol / allegra

## 2019-12-27 NOTE — Telephone Encounter (Signed)
DPR not on file yet.

## 2019-12-27 NOTE — Telephone Encounter (Signed)
Medicaid order sent to [facility]. They will obtain the auth and reach out to the patient to schedule.  

## 2019-12-27 NOTE — Telephone Encounter (Signed)
Pt's mother, Jayvyn Haselton called office and left a VM asking for a return call to discuss if an MRI was ordered today for the pt. She can be reached at [phone].
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
## 2019-12-28 ENCOUNTER — Ambulatory Visit (HOSPITAL_COMMUNITY): Payer: Medicaid Other | Admitting: Physical Therapy

## 2019-12-28 ENCOUNTER — Encounter (HOSPITAL_COMMUNITY): Payer: Self-pay | Admitting: Physical Therapy

## 2019-12-28 ENCOUNTER — Other Ambulatory Visit: Payer: Self-pay

## 2019-12-28 DIAGNOSIS — R262 Difficulty in walking, not elsewhere classified: Secondary | ICD-10-CM

## 2019-12-28 DIAGNOSIS — R2689 Other abnormalities of gait and mobility: Secondary | ICD-10-CM
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
## 2019-12-28 NOTE — Telephone Encounter (Signed)
Spoke with patient and advised him I didn't;t return call to his mother due to [facility] not on file. I advised he may let her know if he prefers. He verbalized understanding, appreciation.

## 2019-12-30 ENCOUNTER — Ambulatory Visit (HOSPITAL_COMMUNITY): Payer: Medicaid Other | Admitting: Physical Therapy

## 2019-12-30 ENCOUNTER — Other Ambulatory Visit: Payer: Self-pay

## 2019-12-30 ENCOUNTER — Encounter (HOSPITAL_COMMUNITY): Payer: Self-pay | Admitting: Physical Therapy

## 2019-12-30 DIAGNOSIS — R2689 Other abnormalities of gait and mobility: Secondary | ICD-10-CM

## 2019-12-30 DIAGNOSIS — R262 Difficulty in walking, not elsewhere classified: Secondary | ICD-10-CM
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
## 2020-01-04 ENCOUNTER — Encounter (HOSPITAL_COMMUNITY): Payer: Self-pay

## 2020-01-04 ENCOUNTER — Other Ambulatory Visit: Payer: Self-pay

## 2020-01-04 ENCOUNTER — Ambulatory Visit (HOSPITAL_COMMUNITY): Payer: Medicaid Other

## 2020-01-04 DIAGNOSIS — R2689 Other abnormalities of gait and mobility: Secondary | ICD-10-CM

## 2020-01-04 DIAGNOSIS — R262 Difficulty in walking, not elsewhere classified: Secondary | ICD-10-CM | POA: Diagnosis not present
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
## 2020-01-06 ENCOUNTER — Other Ambulatory Visit: Payer: Self-pay

## 2020-01-06 ENCOUNTER — Encounter (HOSPITAL_COMMUNITY): Payer: Self-pay | Admitting: Physical Therapy

## 2020-01-06 ENCOUNTER — Ambulatory Visit (HOSPITAL_COMMUNITY): Payer: Medicaid Other | Admitting: Physical Therapy

## 2020-01-06 DIAGNOSIS — R2689 Other abnormalities of gait and mobility: Secondary | ICD-10-CM

## 2020-01-06 DIAGNOSIS — R262 Difficulty in walking, not elsewhere classified: Secondary | ICD-10-CM

## 2020-01-06 DIAGNOSIS — Z23 Encounter for immunization: Secondary | ICD-10-CM | POA: Diagnosis not present
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
## 2020-01-11 ENCOUNTER — Ambulatory Visit (HOSPITAL_COMMUNITY): Payer: Medicaid Other | Attending: Family Medicine | Admitting: Physical Therapy

## 2020-01-11 ENCOUNTER — Encounter (HOSPITAL_COMMUNITY): Payer: Self-pay | Admitting: Physical Therapy

## 2020-01-11 ENCOUNTER — Other Ambulatory Visit: Payer: Self-pay

## 2020-01-11 DIAGNOSIS — R2689 Other abnormalities of gait and mobility: Secondary | ICD-10-CM | POA: Diagnosis present

## 2020-01-11 DIAGNOSIS — R262 Difficulty in walking, not elsewhere classified: Secondary | ICD-10-CM | POA: Insufficient documentation
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
## 2020-01-13 ENCOUNTER — Other Ambulatory Visit: Payer: Self-pay

## 2020-01-13 ENCOUNTER — Encounter (HOSPITAL_COMMUNITY): Payer: Self-pay | Admitting: Physical Therapy

## 2020-01-13 ENCOUNTER — Ambulatory Visit (HOSPITAL_COMMUNITY): Payer: Medicaid Other | Admitting: Physical Therapy

## 2020-01-13 DIAGNOSIS — R262 Difficulty in walking, not elsewhere classified: Secondary | ICD-10-CM | POA: Diagnosis not present

## 2020-01-13 DIAGNOSIS — R2689 Other abnormalities of gait and mobility: Secondary | ICD-10-CM
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
## 2020-01-18 ENCOUNTER — Other Ambulatory Visit: Payer: Self-pay

## 2020-01-18 ENCOUNTER — Encounter (HOSPITAL_COMMUNITY): Payer: Self-pay | Admitting: Physical Therapy

## 2020-01-18 ENCOUNTER — Ambulatory Visit (HOSPITAL_COMMUNITY): Payer: Medicaid Other | Admitting: Physical Therapy

## 2020-01-18 DIAGNOSIS — R262 Difficulty in walking, not elsewhere classified: Secondary | ICD-10-CM | POA: Diagnosis not present

## 2020-01-18 DIAGNOSIS — R2689 Other abnormalities of gait and mobility: Secondary | ICD-10-CM
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
## 2020-01-20 ENCOUNTER — Encounter (HOSPITAL_COMMUNITY): Payer: Medicaid Other | Admitting: Physical Therapy

## 2020-01-28 ENCOUNTER — Other Ambulatory Visit: Payer: Medicaid Other

## 2020-05-22 ENCOUNTER — Ambulatory Visit: Payer: Medicaid Other | Admitting: Internal Medicine

## 2020-06-09 ENCOUNTER — Encounter: Payer: Self-pay | Admitting: Internal Medicine

## 2020-06-09 ENCOUNTER — Ambulatory Visit (INDEPENDENT_AMBULATORY_CARE_PROVIDER_SITE_OTHER): Payer: Medicaid Other | Admitting: Internal Medicine

## 2020-06-09 ENCOUNTER — Other Ambulatory Visit: Payer: Self-pay

## 2020-06-09 VITALS — BP 111/71 | HR 58 | Temp 98.6°F | Resp 18 | Ht 75.0 in | Wt 187.0 lb

## 2020-06-09 DIAGNOSIS — S065X9A Traumatic subdural hemorrhage with loss of consciousness of unspecified duration, initial encounter: Secondary | ICD-10-CM | POA: Insufficient documentation

## 2020-06-09 DIAGNOSIS — Z23 Encounter for immunization: Secondary | ICD-10-CM

## 2020-06-09 DIAGNOSIS — J309 Allergic rhinitis, unspecified: Secondary | ICD-10-CM

## 2020-06-09 DIAGNOSIS — Z7689 Persons encountering health services in other specified circumstances: Secondary | ICD-10-CM | POA: Insufficient documentation

## 2020-06-09 DIAGNOSIS — F0781 Postconcussional syndrome: Secondary | ICD-10-CM

## 2020-06-09 DIAGNOSIS — S065XAA Traumatic subdural hemorrhage with loss of consciousness status unknown, initial encounter: Secondary | ICD-10-CM | POA: Insufficient documentation

## 2020-06-09 NOTE — Patient Instructions (Signed)
Please continue to perform moderate exercise at least 150 mins/week.  Please use puzzles or board games for brain activities.  Please follow up with Neurologist as scheduled.  Please get MRI of brain done as scheduled.  Please get blood tests done within a week.  You were given flu shot today. It is normal to have local soreness up to 2 days.

## 2020-06-09 NOTE — Assessment & Plan Note (Signed)
Care established Previous chart reviewed History and medications reviewed with the patient 
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
## 2020-06-09 NOTE — Assessment & Plan Note (Signed)
Uses Allegra PRN
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
## 2020-06-27 ENCOUNTER — Other Ambulatory Visit: Payer: Self-pay

## 2020-06-27 ENCOUNTER — Ambulatory Visit (HOSPITAL_COMMUNITY): Payer: Medicaid Other

## 2020-07-11 ENCOUNTER — Institutional Professional Consult (permissible substitution): Payer: Medicaid Other | Admitting: Diagnostic Neuroimaging

## 2020-09-11 DIAGNOSIS — Z23 Encounter for immunization: Secondary | ICD-10-CM | POA: Diagnosis not present

## 2020-09-11 DIAGNOSIS — S0990XA Unspecified injury of head, initial encounter: Secondary | ICD-10-CM | POA: Diagnosis not present

## 2020-09-11 DIAGNOSIS — S161XXA Strain of muscle, fascia and tendon at neck level, initial encounter: Secondary | ICD-10-CM | POA: Diagnosis not present

## 2020-09-18 ENCOUNTER — Encounter: Payer: Self-pay | Admitting: Internal Medicine

## 2020-09-18 ENCOUNTER — Other Ambulatory Visit: Payer: Self-pay

## 2020-09-18 ENCOUNTER — Ambulatory Visit: Payer: Medicaid Other | Admitting: Internal Medicine

## 2020-09-18 VITALS — BP 126/85 | HR 87 | Temp 98.2°F | Resp 18 | Ht 75.0 in | Wt 195.8 lb

## 2020-09-18 DIAGNOSIS — Z09 Encounter for follow-up examination after completed treatment for conditions other than malignant neoplasm: Secondary | ICD-10-CM

## 2020-09-18 DIAGNOSIS — G5622 Lesion of ulnar nerve, left upper limb: Secondary | ICD-10-CM | POA: Diagnosis not present

## 2020-09-18 NOTE — Patient Instructions (Signed)
Please continue to take Ibuprofen as needed for neck pain as prescribed.  Please take Flexeril for muscle spasms.  Please use wrist splint for numbness in the hand.
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
## 2020-12-08 ENCOUNTER — Encounter: Payer: Self-pay | Admitting: Internal Medicine

## 2020-12-08 ENCOUNTER — Ambulatory Visit: Payer: Medicaid Other | Admitting: Internal Medicine

## 2020-12-08 ENCOUNTER — Other Ambulatory Visit: Payer: Self-pay

## 2020-12-08 VITALS — BP 110/70 | HR 81 | Temp 98.7°F | Resp 18 | Ht 75.0 in | Wt 196.1 lb

## 2020-12-08 DIAGNOSIS — F0781 Postconcussional syndrome: Secondary | ICD-10-CM | POA: Diagnosis not present

## 2020-12-08 DIAGNOSIS — S065X9S Traumatic subdural hemorrhage with loss of consciousness of unspecified duration, sequela: Secondary | ICD-10-CM

## 2020-12-08 DIAGNOSIS — Z1159 Encounter for screening for other viral diseases: Secondary | ICD-10-CM | POA: Diagnosis not present

## 2020-12-08 DIAGNOSIS — R269 Unspecified abnormalities of gait and mobility: Secondary | ICD-10-CM

## 2020-12-08 DIAGNOSIS — M7989 Other specified soft tissue disorders: Secondary | ICD-10-CM | POA: Insufficient documentation

## 2020-12-08 DIAGNOSIS — Z0000 Encounter for general adult medical examination without abnormal findings: Secondary | ICD-10-CM

## 2020-12-08 DIAGNOSIS — Z114 Encounter for screening for human immunodeficiency virus [HIV]: Secondary | ICD-10-CM | POA: Diagnosis not present
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
## 2020-12-08 NOTE — Patient Instructions (Addendum)
You are being referred to physical therapy for gait and strength training.  Please continue to perform exercises at home for now.  Please keep legs elevated at nighttime and avoid sleeping in a chair to avoid leg swelling.  Please contact your Neurologist office for follow up appointment.

## 2020-12-08 NOTE — Assessment & Plan Note (Signed)
Intermittent, likely due to prolonged dependent edema Advised to avoid sleeping in a chair Leg elevation for edema
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
## 2020-12-11 ENCOUNTER — Telehealth: Payer: Self-pay

## 2020-12-11 NOTE — Telephone Encounter (Signed)
Called [facility] to schedule appointment. [facility] is out of network with his insurance, patient will call his insurance to find in network provider and contact our office back to schedule the appointment.

## 2020-12-12 ENCOUNTER — Encounter (HOSPITAL_COMMUNITY): Payer: Self-pay

## 2020-12-12 ENCOUNTER — Ambulatory Visit (HOSPITAL_COMMUNITY): Payer: Medicaid Other | Attending: Internal Medicine

## 2020-12-12 ENCOUNTER — Other Ambulatory Visit: Payer: Self-pay

## 2020-12-12 DIAGNOSIS — R2681 Unsteadiness on feet: Secondary | ICD-10-CM | POA: Diagnosis not present

## 2020-12-12 DIAGNOSIS — R262 Difficulty in walking, not elsewhere classified: Secondary | ICD-10-CM | POA: Diagnosis not present

## 2020-12-12 DIAGNOSIS — R2689 Other abnormalities of gait and mobility: Secondary | ICD-10-CM | POA: Diagnosis not present
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
## 2020-12-19 ENCOUNTER — Ambulatory Visit (HOSPITAL_COMMUNITY): Payer: Medicaid Other

## 2020-12-19 ENCOUNTER — Other Ambulatory Visit: Payer: Self-pay

## 2020-12-19 DIAGNOSIS — R2689 Other abnormalities of gait and mobility: Secondary | ICD-10-CM | POA: Diagnosis not present

## 2020-12-19 DIAGNOSIS — R262 Difficulty in walking, not elsewhere classified: Secondary | ICD-10-CM

## 2020-12-19 DIAGNOSIS — R2681 Unsteadiness on feet: Secondary | ICD-10-CM
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
## 2020-12-26 ENCOUNTER — Ambulatory Visit (HOSPITAL_COMMUNITY): Payer: Medicaid Other

## 2020-12-26 ENCOUNTER — Other Ambulatory Visit: Payer: Self-pay

## 2020-12-26 ENCOUNTER — Encounter (HOSPITAL_COMMUNITY): Payer: Self-pay

## 2020-12-26 DIAGNOSIS — R2689 Other abnormalities of gait and mobility: Secondary | ICD-10-CM

## 2020-12-26 DIAGNOSIS — R262 Difficulty in walking, not elsewhere classified: Secondary | ICD-10-CM | POA: Diagnosis not present

## 2020-12-26 DIAGNOSIS — R2681 Unsteadiness on feet: Secondary | ICD-10-CM | POA: Diagnosis not present
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
## 2021-01-02 ENCOUNTER — Ambulatory Visit (HOSPITAL_COMMUNITY): Payer: Medicaid Other

## 2021-01-02 ENCOUNTER — Other Ambulatory Visit: Payer: Self-pay

## 2021-01-02 DIAGNOSIS — R2689 Other abnormalities of gait and mobility: Secondary | ICD-10-CM

## 2021-01-02 DIAGNOSIS — R2681 Unsteadiness on feet: Secondary | ICD-10-CM | POA: Diagnosis not present

## 2021-01-02 DIAGNOSIS — R262 Difficulty in walking, not elsewhere classified: Secondary | ICD-10-CM | POA: Diagnosis not present
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
## 2021-01-09 ENCOUNTER — Other Ambulatory Visit: Payer: Self-pay

## 2021-01-09 ENCOUNTER — Ambulatory Visit (HOSPITAL_COMMUNITY): Payer: Medicaid Other | Admitting: Physical Therapy

## 2021-01-09 ENCOUNTER — Encounter (HOSPITAL_COMMUNITY): Payer: Self-pay | Admitting: Physical Therapy

## 2021-01-09 DIAGNOSIS — R2689 Other abnormalities of gait and mobility: Secondary | ICD-10-CM | POA: Diagnosis not present

## 2021-01-09 DIAGNOSIS — R2681 Unsteadiness on feet: Secondary | ICD-10-CM | POA: Diagnosis not present

## 2021-01-09 DIAGNOSIS — R262 Difficulty in walking, not elsewhere classified: Secondary | ICD-10-CM
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
## 2021-01-16 ENCOUNTER — Encounter (HOSPITAL_COMMUNITY): Payer: Self-pay

## 2021-01-16 ENCOUNTER — Other Ambulatory Visit: Payer: Self-pay

## 2021-01-16 ENCOUNTER — Ambulatory Visit (HOSPITAL_COMMUNITY): Payer: Medicaid Other | Attending: Internal Medicine

## 2021-01-16 DIAGNOSIS — R262 Difficulty in walking, not elsewhere classified: Secondary | ICD-10-CM | POA: Insufficient documentation

## 2021-01-16 DIAGNOSIS — R2681 Unsteadiness on feet: Secondary | ICD-10-CM | POA: Insufficient documentation

## 2021-01-16 DIAGNOSIS — R2689 Other abnormalities of gait and mobility: Secondary | ICD-10-CM | POA: Diagnosis not present
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
## 2021-01-17 ENCOUNTER — Institutional Professional Consult (permissible substitution): Payer: Medicaid Other | Admitting: Diagnostic Neuroimaging

## 2021-01-23 ENCOUNTER — Encounter (HOSPITAL_COMMUNITY): Payer: Medicaid Other

## 2021-02-06 ENCOUNTER — Telehealth: Payer: Self-pay

## 2021-02-06 NOTE — Telephone Encounter (Signed)
Disability Forms  Copied Noted Sleeved 

## 2021-02-22 DIAGNOSIS — Z0279 Encounter for issue of other medical certificate: Secondary | ICD-10-CM

## 2021-03-02 ENCOUNTER — Encounter: Payer: Medicaid Other | Admitting: Internal Medicine

## 2021-03-09 ENCOUNTER — Encounter: Payer: Medicaid Other | Admitting: Internal Medicine

## 2021-04-05 ENCOUNTER — Encounter: Payer: Self-pay | Admitting: Internal Medicine

## 2021-04-05 ENCOUNTER — Other Ambulatory Visit: Payer: Self-pay

## 2021-04-05 ENCOUNTER — Ambulatory Visit (INDEPENDENT_AMBULATORY_CARE_PROVIDER_SITE_OTHER): Payer: Medicaid Other | Admitting: Internal Medicine

## 2021-04-05 VITALS — BP 126/79 | HR 62 | Temp 98.5°F | Resp 20 | Ht 75.0 in | Wt 182.0 lb

## 2021-04-05 DIAGNOSIS — S065X9S Traumatic subdural hemorrhage with loss of consciousness of unspecified duration, sequela: Secondary | ICD-10-CM

## 2021-04-05 DIAGNOSIS — Z23 Encounter for immunization: Secondary | ICD-10-CM

## 2021-04-05 DIAGNOSIS — R269 Unspecified abnormalities of gait and mobility: Secondary | ICD-10-CM | POA: Diagnosis not present

## 2021-04-05 DIAGNOSIS — Z0001 Encounter for general adult medical examination with abnormal findings: Secondary | ICD-10-CM
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
## 2021-04-05 NOTE — Patient Instructions (Signed)
Health Maintenance, Male Adopting a healthy lifestyle and getting preventive care are important in promoting health and wellness. Ask your health care provider about: The right schedule for you to have regular tests and exams. Things you can do on your own to prevent diseases and keep yourself healthy. What should I know about diet, weight, and exercise? Eat a healthy diet  Eat a diet that includes plenty of vegetables, fruits, low-fat dairy products, and lean protein. Do not eat a lot of foods that are high in solid fats, added sugars, or sodium.  Maintain a healthy weight Body mass index (BMI) is a measurement that can be used to identify possible weight problems. It estimates body fat based on height and weight. Your health care provider can help determine your BMI and help you achieve or maintain ahealthy weight. Get regular exercise Get regular exercise. This is one of the most important things you can do for your health. Most adults should: Exercise for at least 150 minutes each week. The exercise should increase your heart rate and make you sweat (moderate-intensity exercise). Do strengthening exercises at least twice a week. This is in addition to the moderate-intensity exercise. Spend less time sitting. Even light physical activity can be beneficial. Watch cholesterol and blood lipids Have your blood tested for lipids and cholesterol at 32 years of age, then havethis test every 5 years. You may need to have your cholesterol levels checked more often if: Your lipid or cholesterol levels are high. You are older than 32 years of age. You are at high risk for heart disease. What should I know about cancer screening? Many types of cancers can be detected early and may often be prevented. Depending on your health history and family history, you may need to have cancer screening at various ages. This may include screening for: Colorectal cancer. Prostate cancer. Skin cancer. Lung  cancer. What should I know about heart disease, diabetes, and high blood pressure? Blood pressure and heart disease High blood pressure causes heart disease and increases the risk of stroke. This is more likely to develop in people who have high blood pressure readings, are of African descent, or are overweight. Talk with your health care provider about your target blood pressure readings. Have your blood pressure checked: Every 3-5 years if you are 18-39 years of age. Every year if you are 40 years old or older. If you are between the ages of 65 and 75 and are a current or former smoker, ask your health care provider if you should have a one-time screening for abdominal aortic aneurysm (AAA). Diabetes Have regular diabetes screenings. This checks your fasting blood sugar level. Have the screening done: Once every three years after age 45 if you are at a normal weight and have a low risk for diabetes. More often and at a younger age if you are overweight or have a high risk for diabetes. What should I know about preventing infection? Hepatitis B If you have a higher risk for hepatitis B, you should be screened for this virus. Talk with your health care provider to find out if you are at risk forhepatitis B infection. Hepatitis C Blood testing is recommended for: Everyone born from 1945 through 1965. Anyone with known risk factors for hepatitis C. Sexually transmitted infections (STIs) You should be screened each year for STIs, including gonorrhea and chlamydia, if: You are sexually active and are younger than 32 years of age. You are older than 32 years of age   and your health care provider tells you that you are at risk for this type of infection. Your sexual activity has changed since you were last screened, and you are at increased risk for chlamydia or gonorrhea. Ask your health care provider if you are at risk. Ask your health care provider about whether you are at high risk for HIV.  Your health care provider may recommend a prescription medicine to help prevent HIV infection. If you choose to take medicine to prevent HIV, you should first get tested for HIV. You should then be tested every 3 months for as long as you are taking the medicine. Follow these instructions at home: Lifestyle Do not use any products that contain nicotine or tobacco, such as cigarettes, e-cigarettes, and chewing tobacco. If you need help quitting, ask your health care provider. Do not use street drugs. Do not share needles. Ask your health care provider for help if you need support or information about quitting drugs. Alcohol use Do not drink alcohol if your health care provider tells you not to drink. If you drink alcohol: Limit how much you have to 0-2 drinks a day. Be aware of how much alcohol is in your drink. In the [place] one drink equals one 12 oz bottle of beer (355 mL), one 5 oz glass of wine (148 mL), or one 1 oz glass of hard liquor (44 mL). General instructions Schedule regular health, dental, and eye exams. Stay current with your vaccines. Tell your health care provider if: You often feel depressed. You have ever been abused or do not feel safe at home. Summary Adopting a healthy lifestyle and getting preventive care are important in promoting health and wellness. Follow your health care provider's instructions about healthy diet, exercising, and getting tested or screened for diseases. Follow your health care provider's instructions on monitoring your cholesterol and blood pressure. This information is not intended to replace advice given to you by your health care provider. Make sure you discuss any questions you have with your healthcare provider. Document Revised: 07/22/2018 Document Reviewed: 07/22/2018 Elsevier Patient Education  2022 Elsevier Inc.  

## 2021-04-05 NOTE — Assessment & Plan Note (Signed)
Most likely due to h/o traumatic brain injury Check CMP, TSH and Vitamin B12 Completed PT - now improved

## 2021-04-05 NOTE — Addendum Note (Signed)
Addended by: Dellia Cloud on: 04/05/2021 11:19 AM   Modules accepted: Orders

## 2021-04-05 NOTE — Assessment & Plan Note (Signed)
Annual exam as documented. Counseling done  re healthy lifestyle involving commitment to 150 minutes exercise per week, heart healthy diet, and attaining healthy weight.The importance of adequate sleep also discussed. Changes in health habits are decided on by the patient with goals and time frames  set for achieving them. Immunization and cancer screening needs are specifically addressed at this visit. 
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
## 2021-10-11 ENCOUNTER — Encounter: Payer: Self-pay | Admitting: Internal Medicine

## 2021-10-11 ENCOUNTER — Other Ambulatory Visit: Payer: Self-pay

## 2021-10-11 ENCOUNTER — Ambulatory Visit: Payer: Medicaid Other | Admitting: Internal Medicine

## 2021-10-11 VITALS — BP 108/72 | HR 62 | Resp 18 | Ht 74.0 in | Wt 185.0 lb

## 2021-10-11 DIAGNOSIS — R269 Unspecified abnormalities of gait and mobility: Secondary | ICD-10-CM | POA: Diagnosis not present

## 2021-10-11 DIAGNOSIS — S065X9S Traumatic subdural hemorrhage with loss of consciousness of unspecified duration, sequela: Secondary | ICD-10-CM

## 2021-10-11 DIAGNOSIS — I69954 Hemiplegia and hemiparesis following unspecified cerebrovascular disease affecting left non-dominant side: Secondary | ICD-10-CM
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
## 2021-10-11 NOTE — Assessment & Plan Note (Signed)
Most likely due to h/o traumatic brain injury ?Has LLE weakness ?Offered PT referral again, but he has transportation concern ?Referred to [facility] for managed Medicaid resources ?
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
## 2021-10-15 ENCOUNTER — Other Ambulatory Visit: Payer: Self-pay

## 2021-10-15 ENCOUNTER — Other Ambulatory Visit: Payer: Self-pay | Admitting: Obstetrics and Gynecology
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
## 2021-11-15 ENCOUNTER — Other Ambulatory Visit: Payer: Self-pay | Admitting: Obstetrics and Gynecology
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
## 2022-01-15 ENCOUNTER — Other Ambulatory Visit: Payer: Self-pay | Admitting: Obstetrics and Gynecology
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
## 2022-01-15 NOTE — Patient Outreach (Signed)
Care Coordination  01/15/2022  Dakota Pena 06/29/1991 [id number]   Medicaid Managed Care   Unsuccessful Outreach Note  01/15/2022 Name: Dakota Pena MRN: [id number] DOB: August 27, 1990  Referred by: [name], MD Reason for referral : High Risk Managed Medicaid (Case management follow up)   An unsuccessful telephone outreach was attempted today. The patient was referred to the case management team for assistance with care management and care coordination.   Follow Up Plan: The care management team will reach out to the patient again over the next 30 business  days.   Kathi Der RN, BSN [place]  [facility] - Managed Medicaid High Risk [phone]

## 2022-01-15 NOTE — Patient Instructions (Signed)
Hi Mr. Sadowsky, I am sorry I missed you today - as a part of your Medicaid benefit, you are eligible for care management and care coordination services at no cost or copay. I was unable to reach you by phone today but would be happy to help you with your health related needs. Please feel free to call me at [phone]  A member of the Managed Medicaid care management team will reach out to you again over the next 30 business  days.   Kathi Der RN, BSN [place]  [facility] - Managed Medicaid High Risk [phone]
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
## 2022-02-14 ENCOUNTER — Other Ambulatory Visit: Payer: Self-pay | Admitting: Obstetrics and Gynecology
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
## 2022-04-18 ENCOUNTER — Ambulatory Visit (INDEPENDENT_AMBULATORY_CARE_PROVIDER_SITE_OTHER): Payer: Medicaid Other | Admitting: Internal Medicine

## 2022-04-18 ENCOUNTER — Encounter: Payer: Self-pay | Admitting: Internal Medicine

## 2022-04-18 VITALS — BP 118/76 | HR 62 | Resp 18 | Ht 75.0 in | Wt 177.8 lb

## 2022-04-18 DIAGNOSIS — L729 Follicular cyst of the skin and subcutaneous tissue, unspecified: Secondary | ICD-10-CM | POA: Insufficient documentation

## 2022-04-18 DIAGNOSIS — Z0001 Encounter for general adult medical examination with abnormal findings: Secondary | ICD-10-CM

## 2022-04-18 DIAGNOSIS — Z23 Encounter for immunization: Secondary | ICD-10-CM | POA: Diagnosis not present

## 2022-04-18 DIAGNOSIS — Z114 Encounter for screening for human immunodeficiency virus [HIV]: Secondary | ICD-10-CM | POA: Diagnosis not present

## 2022-04-18 DIAGNOSIS — Z1159 Encounter for screening for other viral diseases: Secondary | ICD-10-CM | POA: Diagnosis not present

## 2022-04-18 DIAGNOSIS — T148XXA Other injury of unspecified body region, initial encounter: Secondary | ICD-10-CM | POA: Diagnosis not present

## 2022-04-18 DIAGNOSIS — I69954 Hemiplegia and hemiparesis following unspecified cerebrovascular disease affecting left non-dominant side: Secondary | ICD-10-CM

## 2022-04-18 MED ORDER — MUPIROCIN 2 % EX OINT: 1.0000 | TOPICAL_OINTMENT | Freq: Two times a day (BID) | CUTANEOUS | 0 refills | Status: DC
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
## 2022-04-18 NOTE — Assessment & Plan Note (Signed)
Physical exam as documented. Fasting blood tests done today.

## 2022-04-18 NOTE — Assessment & Plan Note (Signed)
Right ankle medial aspect and left ankle lateral aspect cysts noted, about 1 cm in diameter, benign
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
## 2022-04-18 NOTE — Patient Instructions (Signed)
Please apply Mupirocin ointment over right foot blister area. Keep area clean and dry.  Please continue to perform moderate exercise/walking as tolerated.

## 2022-04-18 NOTE — Assessment & Plan Note (Signed)

## 2022-04-18 NOTE — Assessment & Plan Note (Signed)
Patient ruptured it with clean needle after applying peroxide Appears clean, no pus noted Mupirocin ointment for bacterial ppx

## 2022-04-19 ENCOUNTER — Encounter: Payer: Self-pay | Admitting: Internal Medicine

## 2022-04-19 LAB — CMP14+EGFR
ALT: 30 IU/L (ref 0–44)
AST: 29 IU/L (ref 0–40)
Albumin/Globulin Ratio: 1.8 (ref 1.2–2.2)
Albumin: 4.7 g/dL (ref 4.1–5.1)
Alkaline Phosphatase: 111 IU/L (ref 44–121)
BUN/Creatinine Ratio: 11 (ref 9–20)
BUN: 12 mg/dL (ref 6–20)
Bilirubin Total: 1 mg/dL (ref 0.0–1.2)
CO2: 23 mmol/L (ref 20–29)
Calcium: 9.7 mg/dL (ref 8.7–10.2)
Chloride: 105 mmol/L (ref 96–106)
Creatinine, Ser: 1.06 mg/dL (ref 0.76–1.27)
Globulin, Total: 2.6 g/dL (ref 1.5–4.5)
Glucose: 97 mg/dL (ref 70–99)
Potassium: 4.4 mmol/L (ref 3.5–5.2)
Sodium: 142 mmol/L (ref 134–144)
Total Protein: 7.3 g/dL (ref 6.0–8.5)
eGFR: 95 mL/min/{1.73_m2} (ref >59)

## 2022-04-19 LAB — CBC WITH DIFFERENTIAL/PLATELET
Basophils Absolute: 0.1 10*3/uL (ref 0.0–0.2)
Basos: 1 %
EOS (ABSOLUTE): 0.8 10*3/uL — ABNORMAL HIGH (ref 0.0–0.4)
Eos: 15 %
Hematocrit: 41 % (ref 37.5–51.0)
Hemoglobin: 13.3 g/dL (ref 13.0–17.7)
Immature Grans (Abs): 0 10*3/uL (ref 0.0–0.1)
Immature Granulocytes: 0 %
Lymphocytes Absolute: 1.1 10*3/uL (ref 0.7–3.1)
Lymphs: 21 %
MCH: 26.2 pg — ABNORMAL LOW (ref 26.6–33.0)
MCHC: 32.4 g/dL (ref 31.5–35.7)
MCV: 81 fL (ref 79–97)
Monocytes Absolute: 0.4 10*3/uL (ref 0.1–0.9)
Monocytes: 8 %
Neutrophils Absolute: 2.8 10*3/uL (ref 1.4–7.0)
Neutrophils: 55 %
Platelets: 209 10*3/uL (ref 150–450)
RBC: 5.07 x10E6/uL (ref 4.14–5.80)
RDW: 14 % (ref 11.6–15.4)
WBC: 5.1 10*3/uL (ref 3.4–10.8)

## 2022-04-19 LAB — LIPID PANEL
Chol/HDL Ratio: 2.2 ratio (ref 0.0–5.0)
Cholesterol, Total: 177 mg/dL (ref 100–199)
HDL: 79 mg/dL (ref >39)
LDL Chol Calc (NIH): 88 mg/dL (ref 0–99)
Triglycerides: 47 mg/dL (ref 0–149)
VLDL Cholesterol Cal: 10 mg/dL (ref 5–40)

## 2022-04-19 LAB — HEPATITIS C ANTIBODY: Hep C Virus Ab: NONREACTIVE

## 2022-04-19 LAB — HIV ANTIBODY (ROUTINE TESTING W REFLEX): HIV Screen 4th Generation wRfx: NONREACTIVE

## 2022-04-23 ENCOUNTER — Encounter: Payer: Self-pay | Admitting: Internal Medicine

## 2022-05-01 ENCOUNTER — Telehealth: Payer: Self-pay

## 2022-05-01 ENCOUNTER — Other Ambulatory Visit: Payer: Self-pay

## 2022-05-01 DIAGNOSIS — T148XXA Other injury of unspecified body region, initial encounter: Secondary | ICD-10-CM

## 2022-05-01 MED ORDER — MUPIROCIN 2 % EX OINT: 1.0000 | TOPICAL_OINTMENT | Freq: Two times a day (BID) | CUTANEOUS | 0 refills | Status: DC

## 2022-05-01 NOTE — Telephone Encounter (Signed)
Refills sent

## 2022-05-01 NOTE — Telephone Encounter (Signed)
Patient called need med refill  mupirocin ointment Drue Stager) 2    Pharmacy: [facility]

## 2022-05-10 ENCOUNTER — Other Ambulatory Visit: Payer: Self-pay | Admitting: Obstetrics and Gynecology
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
## 2022-06-13 ENCOUNTER — Other Ambulatory Visit: Payer: Self-pay | Admitting: Obstetrics and Gynecology
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
## 2022-07-16 ENCOUNTER — Other Ambulatory Visit: Payer: Medicaid Other | Admitting: Obstetrics and Gynecology

## 2022-07-16 NOTE — Patient Outreach (Signed)
  Medicaid Managed Care   Unsuccessful Attempt Note   07/16/2022 Name: Dakota Pena MRN: [id number] DOB: 10-11-1990  Referred by: [name], MD Reason for referral : High Risk Managed Medicaid (Unsuccessful telephone outreach)  An unsuccessful telephone outreach was attempted today. The patient was referred to the case management team for assistance with care management and care coordination.    Follow Up Plan: The Managed Medicaid care management team will reach out to the patient again over the next 30 business  days. and The  Patient has been provided with contact information for the Managed Medicaid care management team and has been advised to call with any health related questions or concerns.    Kathi Der RN, BSN [place]  [employer] Engineer, production - Managed Medicaid High Risk [phone]

## 2022-07-16 NOTE — Patient Instructions (Signed)
Hi Dakota Pena, sorry to have missed you today - as a part of your Medicaid benefit, you are eligible for care management and care coordination services at no cost or copay. I was unable to reach you by phone today but would be happy to help you with your health related needs. Please feel free to call me at [phone].  A member of the Managed Medicaid care management team will reach out to you again over the next 30 business  days.   Kathi Der RN, BSN [place]  [employer], production - Managed Medicaid High Risk [phone]

## 2022-10-17 ENCOUNTER — Ambulatory Visit: Payer: Medicaid Other | Admitting: Internal Medicine

## 2022-10-17 ENCOUNTER — Encounter: Payer: Self-pay | Admitting: Internal Medicine

## 2022-10-17 VITALS — BP 123/78 | HR 70 | Ht 75.0 in | Wt 182.0 lb

## 2022-10-17 DIAGNOSIS — M25662 Stiffness of left knee, not elsewhere classified: Secondary | ICD-10-CM | POA: Diagnosis not present

## 2022-10-17 DIAGNOSIS — I69954 Hemiplegia and hemiparesis following unspecified cerebrovascular disease affecting left non-dominant side: Secondary | ICD-10-CM | POA: Diagnosis not present

## 2022-10-17 DIAGNOSIS — G43709 Chronic migraine without aura, not intractable, without status migrainosus: Secondary | ICD-10-CM | POA: Diagnosis not present

## 2022-10-17 DIAGNOSIS — M25531 Pain in right wrist: Secondary | ICD-10-CM | POA: Diagnosis not present

## 2022-10-17 DIAGNOSIS — R269 Unspecified abnormalities of gait and mobility: Secondary | ICD-10-CM | POA: Diagnosis not present

## 2022-10-17 DIAGNOSIS — F0781 Postconcussional syndrome: Secondary | ICD-10-CM

## 2022-10-17 MED ORDER — DICLOFENAC SODIUM 1 % EX GEL: 4.0000 g | Freq: Four times a day (QID) | CUTANEOUS | 0 refills | Status: AC
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
## 2022-10-17 NOTE — Patient Instructions (Signed)
Please apply Diclofenac gel over right wrist for pain.  Please get X-ray of right wrist done at [place] are being referred to PT for knee stiffness and gait problem.
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
## 2022-10-17 NOTE — Assessment & Plan Note (Addendum)
Has mild, intermittent pain Check x-ray of right wrist Advised to wear wrist brace Voltaren gel as needed for pain

## 2023-04-25 ENCOUNTER — Encounter: Payer: Medicaid Other | Admitting: Internal Medicine

## 2023-04-28 ENCOUNTER — Encounter: Payer: Self-pay | Admitting: Internal Medicine

## 2023-09-04 ENCOUNTER — Encounter: Payer: Self-pay | Admitting: Family Medicine

## 2023-10-08 ENCOUNTER — Ambulatory Visit: Payer: Self-pay | Admitting: Internal Medicine

## 2023-10-13 ENCOUNTER — Encounter: Payer: Medicaid Other | Admitting: Internal Medicine

## 2023-12-18 ENCOUNTER — Encounter: Payer: Self-pay | Admitting: Internal Medicine

## 2023-12-18 ENCOUNTER — Ambulatory Visit (INDEPENDENT_AMBULATORY_CARE_PROVIDER_SITE_OTHER): Admitting: Internal Medicine

## 2023-12-18 VITALS — BP 121/82 | HR 60 | Ht 75.0 in | Wt 184.0 lb

## 2023-12-18 DIAGNOSIS — R269 Unspecified abnormalities of gait and mobility: Secondary | ICD-10-CM

## 2023-12-18 DIAGNOSIS — M25662 Stiffness of left knee, not elsewhere classified: Secondary | ICD-10-CM | POA: Diagnosis not present

## 2023-12-18 DIAGNOSIS — E785 Hyperlipidemia, unspecified: Secondary | ICD-10-CM

## 2023-12-18 DIAGNOSIS — E559 Vitamin D deficiency, unspecified: Secondary | ICD-10-CM

## 2023-12-18 DIAGNOSIS — S065X9S Traumatic subdural hemorrhage with loss of consciousness of unspecified duration, sequela: Secondary | ICD-10-CM | POA: Diagnosis not present

## 2023-12-18 DIAGNOSIS — I69954 Hemiplegia and hemiparesis following unspecified cerebrovascular disease affecting left non-dominant side: Secondary | ICD-10-CM

## 2023-12-18 DIAGNOSIS — R739 Hyperglycemia, unspecified: Secondary | ICD-10-CM

## 2023-12-18 DIAGNOSIS — Z0001 Encounter for general adult medical examination with abnormal findings: Secondary | ICD-10-CM

## 2023-12-18 DIAGNOSIS — E538 Deficiency of other specified B group vitamins: Secondary | ICD-10-CM

## 2023-12-18 DIAGNOSIS — R413 Other amnesia: Secondary | ICD-10-CM

## 2023-12-18 MED ORDER — MISC. DEVICES MISC: 0 refills | Status: AC

## 2023-12-18 MED ORDER — MISC. DEVICES MISC: 0 refills | Status: DC

## 2023-12-18 NOTE — Patient Instructions (Signed)
 Please continue to take medications as prescribed.  Please continue to perform simple exercises for leg strength.
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
## 2023-12-18 NOTE — Assessment & Plan Note (Signed)
 Left knee stiffness without swelling Prescribed knee brace May benefit from PT If persistent concern, will get orthopedic surgery evaluation
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
## 2023-12-18 NOTE — Assessment & Plan Note (Signed)
 Most likely due to h/o traumatic brain injury Has LLE weakness PT referral again as he has left knee weakness

## 2023-12-18 NOTE — Assessment & Plan Note (Signed)
 MoCA: 27/30 Has a history of postconcussion syndrome Needs neurology evaluation - referred to [facility] neurology

## 2023-12-18 NOTE — Assessment & Plan Note (Signed)
Physical exam as documented. Fasting blood tests done today. 

## 2023-12-19 ENCOUNTER — Ambulatory Visit (HOSPITAL_COMMUNITY): Attending: Internal Medicine

## 2023-12-19 ENCOUNTER — Telehealth: Payer: Self-pay | Admitting: Internal Medicine

## 2023-12-19 ENCOUNTER — Encounter: Payer: Self-pay | Admitting: Internal Medicine

## 2023-12-19 ENCOUNTER — Encounter (HOSPITAL_COMMUNITY): Payer: Self-pay

## 2023-12-19 ENCOUNTER — Other Ambulatory Visit: Payer: Self-pay

## 2023-12-19 DIAGNOSIS — R269 Unspecified abnormalities of gait and mobility: Secondary | ICD-10-CM | POA: Diagnosis not present

## 2023-12-19 DIAGNOSIS — I69954 Hemiplegia and hemiparesis following unspecified cerebrovascular disease affecting left non-dominant side: Secondary | ICD-10-CM | POA: Diagnosis not present

## 2023-12-19 DIAGNOSIS — R2689 Other abnormalities of gait and mobility: Secondary | ICD-10-CM | POA: Diagnosis not present

## 2023-12-19 DIAGNOSIS — Z7409 Other reduced mobility: Secondary | ICD-10-CM | POA: Diagnosis not present

## 2023-12-19 DIAGNOSIS — R29898 Other symptoms and signs involving the musculoskeletal system: Secondary | ICD-10-CM | POA: Diagnosis not present

## 2023-12-19 LAB — CMP14+EGFR
ALT: 39 IU/L (ref 0–44)
AST: 34 IU/L (ref 0–40)
Albumin: 4.7 g/dL (ref 4.1–5.1)
Alkaline Phosphatase: 139 IU/L — ABNORMAL HIGH (ref 44–121)
BUN/Creatinine Ratio: 13 (ref 9–20)
BUN: 12 mg/dL (ref 6–20)
Bilirubin Total: 0.7 mg/dL (ref 0.0–1.2)
CO2: 22 mmol/L (ref 20–29)
Calcium: 9.9 mg/dL (ref 8.7–10.2)
Chloride: 102 mmol/L (ref 96–106)
Creatinine, Ser: 0.93 mg/dL (ref 0.76–1.27)
Globulin, Total: 2.8 g/dL (ref 1.5–4.5)
Glucose: 86 mg/dL (ref 70–99)
Potassium: 4.7 mmol/L (ref 3.5–5.2)
Sodium: 139 mmol/L (ref 134–144)
Total Protein: 7.5 g/dL (ref 6.0–8.5)
eGFR: 111 mL/min/{1.73_m2} (ref >59)

## 2023-12-19 LAB — VITAMIN B12: Vitamin B-12: 658 pg/mL (ref 232–1245)

## 2023-12-19 LAB — CBC WITH DIFFERENTIAL/PLATELET
Basophils Absolute: 0.1 10*3/uL (ref 0.0–0.2)
Basos: 1 %
EOS (ABSOLUTE): 0.7 10*3/uL — ABNORMAL HIGH (ref 0.0–0.4)
Eos: 16 %
Hematocrit: 42.9 % (ref 37.5–51.0)
Hemoglobin: 14.1 g/dL (ref 13.0–17.7)
Immature Grans (Abs): 0 10*3/uL (ref 0.0–0.1)
Immature Granulocytes: 0 %
Lymphocytes Absolute: 1.3 10*3/uL (ref 0.7–3.1)
Lymphs: 30 %
MCH: 27.1 pg (ref 26.6–33.0)
MCHC: 32.9 g/dL (ref 31.5–35.7)
MCV: 83 fL (ref 79–97)
Monocytes Absolute: 0.4 10*3/uL (ref 0.1–0.9)
Monocytes: 8 %
Neutrophils Absolute: 2 10*3/uL (ref 1.4–7.0)
Neutrophils: 45 %
Platelets: 231 10*3/uL (ref 150–450)
RBC: 5.2 x10E6/uL (ref 4.14–5.80)
RDW: 13.1 % (ref 11.6–15.4)
WBC: 4.4 10*3/uL (ref 3.4–10.8)

## 2023-12-19 LAB — TSH: TSH: 1.68 u[IU]/mL (ref 0.450–4.500)

## 2023-12-19 LAB — HEMOGLOBIN A1C
Est. average glucose Bld gHb Est-mCnc: 120 mg/dL
Hgb A1c MFr Bld: 5.8 % — ABNORMAL HIGH (ref 4.8–5.6)

## 2023-12-19 LAB — LIPID PANEL
Chol/HDL Ratio: 2.4 ratio (ref 0.0–5.0)
Cholesterol, Total: 176 mg/dL (ref 100–199)
HDL: 74 mg/dL (ref >39)
LDL Chol Calc (NIH): 95 mg/dL (ref 0–99)
Triglycerides: 33 mg/dL (ref 0–149)
VLDL Cholesterol Cal: 7 mg/dL (ref 5–40)

## 2023-12-19 LAB — VITAMIN D 25 HYDROXY (VIT D DEFICIENCY, FRACTURES): Vit D, 25-Hydroxy: 24.9 ng/mL — ABNORMAL LOW (ref 30.0–100.0)
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
## 2023-12-19 NOTE — Telephone Encounter (Signed)
 Spoke with the pharmacy and she said they didn't provide custom fit braces so I explained what it was needed for and she is going to call the pt to come back to get the patellar stabilizer brace and make sure it fits well and provides support

## 2023-12-19 NOTE — Telephone Encounter (Signed)
 Copied from CRM [id number]. Topic: Clinical - Prescription Issue >> Dec 19, 2023 10:22 AM [place] wrote: Reason for CRM: Tammy with [facility] calling- needs more details on the prescription for knee brace- [phone]

## 2024-01-01 ENCOUNTER — Ambulatory Visit (HOSPITAL_COMMUNITY): Admitting: Physical Therapy

## 2024-01-01 DIAGNOSIS — I69954 Hemiplegia and hemiparesis following unspecified cerebrovascular disease affecting left non-dominant side: Secondary | ICD-10-CM | POA: Diagnosis not present

## 2024-01-01 DIAGNOSIS — Z7409 Other reduced mobility: Secondary | ICD-10-CM | POA: Diagnosis not present

## 2024-01-01 DIAGNOSIS — R2689 Other abnormalities of gait and mobility: Secondary | ICD-10-CM | POA: Diagnosis not present

## 2024-01-01 DIAGNOSIS — R29898 Other symptoms and signs involving the musculoskeletal system: Secondary | ICD-10-CM | POA: Diagnosis not present

## 2024-01-01 DIAGNOSIS — R269 Unspecified abnormalities of gait and mobility: Secondary | ICD-10-CM | POA: Diagnosis not present
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
## 2024-01-08 ENCOUNTER — Ambulatory Visit (HOSPITAL_COMMUNITY): Admitting: Physical Therapy

## 2024-01-08 ENCOUNTER — Telehealth (HOSPITAL_COMMUNITY): Payer: Self-pay | Admitting: Physical Therapy

## 2024-01-08 DIAGNOSIS — Z7409 Other reduced mobility: Secondary | ICD-10-CM | POA: Diagnosis not present

## 2024-01-08 DIAGNOSIS — R2689 Other abnormalities of gait and mobility: Secondary | ICD-10-CM | POA: Diagnosis not present

## 2024-01-08 DIAGNOSIS — R269 Unspecified abnormalities of gait and mobility: Secondary | ICD-10-CM | POA: Diagnosis not present

## 2024-01-08 DIAGNOSIS — R29898 Other symptoms and signs involving the musculoskeletal system: Secondary | ICD-10-CM | POA: Diagnosis not present

## 2024-01-08 DIAGNOSIS — I69954 Hemiplegia and hemiparesis following unspecified cerebrovascular disease affecting left non-dominant side: Secondary | ICD-10-CM | POA: Diagnosis not present

## 2024-01-08 NOTE — Telephone Encounter (Signed)
 Pt did not show for appt. Called pt and reported he was running late and just leaving [place]; explained he would only have 10-15 minutes once he got here and could R/S.  Pt reported he would come at his next scheduled appt.  Lorenso Romance, PTA/CLT [facility] Ph: [phone]
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
## 2024-01-13 ENCOUNTER — Ambulatory Visit (HOSPITAL_COMMUNITY): Attending: Internal Medicine | Admitting: Physical Therapy

## 2024-01-13 DIAGNOSIS — R29898 Other symptoms and signs involving the musculoskeletal system: Secondary | ICD-10-CM | POA: Insufficient documentation

## 2024-01-13 DIAGNOSIS — Z7409 Other reduced mobility: Secondary | ICD-10-CM | POA: Diagnosis present

## 2024-01-13 DIAGNOSIS — R2689 Other abnormalities of gait and mobility: Secondary | ICD-10-CM | POA: Insufficient documentation
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
## 2024-01-15 ENCOUNTER — Encounter (HOSPITAL_COMMUNITY): Payer: Self-pay

## 2024-01-15 ENCOUNTER — Ambulatory Visit (HOSPITAL_COMMUNITY)

## 2024-01-15 DIAGNOSIS — R29898 Other symptoms and signs involving the musculoskeletal system: Secondary | ICD-10-CM

## 2024-01-15 DIAGNOSIS — R2689 Other abnormalities of gait and mobility: Secondary | ICD-10-CM

## 2024-01-15 DIAGNOSIS — Z7409 Other reduced mobility: Secondary | ICD-10-CM
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
## 2024-01-16 ENCOUNTER — Ambulatory Visit (HOSPITAL_COMMUNITY)
Admission: RE | Admit: 2024-01-16 | Discharge: 2024-01-16 | Disposition: A | Discharge status: Home / Self Care | Source: Ambulatory Visit | Attending: Internal Medicine | Admitting: Internal Medicine

## 2024-01-16 DIAGNOSIS — R269 Unspecified abnormalities of gait and mobility: Secondary | ICD-10-CM | POA: Insufficient documentation

## 2024-01-16 DIAGNOSIS — I69954 Hemiplegia and hemiparesis following unspecified cerebrovascular disease affecting left non-dominant side: Secondary | ICD-10-CM | POA: Diagnosis not present

## 2024-01-16 DIAGNOSIS — S065X9S Traumatic subdural hemorrhage with loss of consciousness of unspecified duration, sequela: Secondary | ICD-10-CM | POA: Diagnosis not present

## 2024-01-16 DIAGNOSIS — J320 Chronic maxillary sinusitis: Secondary | ICD-10-CM | POA: Diagnosis not present

## 2024-01-16 DIAGNOSIS — G8194 Hemiplegia, unspecified affecting left nondominant side: Secondary | ICD-10-CM | POA: Diagnosis not present

## 2024-01-21 ENCOUNTER — Encounter (HOSPITAL_COMMUNITY): Payer: Self-pay

## 2024-01-21 ENCOUNTER — Ambulatory Visit (HOSPITAL_COMMUNITY)

## 2024-01-21 DIAGNOSIS — R29898 Other symptoms and signs involving the musculoskeletal system: Secondary | ICD-10-CM

## 2024-01-21 DIAGNOSIS — Z7409 Other reduced mobility: Secondary | ICD-10-CM

## 2024-01-21 DIAGNOSIS — R2689 Other abnormalities of gait and mobility: Secondary | ICD-10-CM | POA: Diagnosis not present
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
## 2024-01-23 ENCOUNTER — Encounter (HOSPITAL_COMMUNITY): Payer: Self-pay

## 2024-01-23 ENCOUNTER — Ambulatory Visit (HOSPITAL_COMMUNITY)

## 2024-01-23 DIAGNOSIS — R2689 Other abnormalities of gait and mobility: Secondary | ICD-10-CM

## 2024-01-23 DIAGNOSIS — R29898 Other symptoms and signs involving the musculoskeletal system: Secondary | ICD-10-CM

## 2024-01-23 DIAGNOSIS — Z7409 Other reduced mobility: Secondary | ICD-10-CM
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
## 2024-01-27 ENCOUNTER — Ambulatory Visit (HOSPITAL_COMMUNITY)

## 2024-01-27 ENCOUNTER — Encounter (HOSPITAL_COMMUNITY): Payer: Self-pay

## 2024-01-27 DIAGNOSIS — R29898 Other symptoms and signs involving the musculoskeletal system: Secondary | ICD-10-CM

## 2024-01-27 DIAGNOSIS — R2689 Other abnormalities of gait and mobility: Secondary | ICD-10-CM

## 2024-01-27 DIAGNOSIS — Z7409 Other reduced mobility: Secondary | ICD-10-CM
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
## 2024-01-29 ENCOUNTER — Ambulatory Visit (HOSPITAL_COMMUNITY)

## 2024-02-03 ENCOUNTER — Encounter (HOSPITAL_COMMUNITY): Payer: Self-pay

## 2024-02-03 ENCOUNTER — Ambulatory Visit (HOSPITAL_COMMUNITY)

## 2024-02-03 DIAGNOSIS — Z7409 Other reduced mobility: Secondary | ICD-10-CM

## 2024-02-03 DIAGNOSIS — R29898 Other symptoms and signs involving the musculoskeletal system: Secondary | ICD-10-CM

## 2024-02-03 DIAGNOSIS — R2689 Other abnormalities of gait and mobility: Secondary | ICD-10-CM | POA: Diagnosis not present
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
## 2024-02-05 ENCOUNTER — Ambulatory Visit (HOSPITAL_COMMUNITY)

## 2024-02-09 ENCOUNTER — Ambulatory Visit: Payer: Self-pay | Admitting: Internal Medicine

## 2024-02-10 ENCOUNTER — Ambulatory Visit (HOSPITAL_COMMUNITY)

## 2024-02-12 ENCOUNTER — Encounter (HOSPITAL_COMMUNITY): Payer: Self-pay

## 2024-02-12 ENCOUNTER — Ambulatory Visit (HOSPITAL_COMMUNITY): Attending: Internal Medicine

## 2024-02-12 DIAGNOSIS — R29898 Other symptoms and signs involving the musculoskeletal system: Secondary | ICD-10-CM | POA: Diagnosis not present

## 2024-02-12 DIAGNOSIS — Z7409 Other reduced mobility: Secondary | ICD-10-CM | POA: Insufficient documentation

## 2024-02-12 DIAGNOSIS — R2689 Other abnormalities of gait and mobility: Secondary | ICD-10-CM | POA: Insufficient documentation
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
## 2024-05-03 ENCOUNTER — Ambulatory Visit: Admitting: Diagnostic Neuroimaging

## 2024-05-03 ENCOUNTER — Encounter: Payer: Self-pay | Admitting: Diagnostic Neuroimaging

## 2024-05-03 VITALS — BP 122/86 | HR 78 | Ht 75.0 in | Wt 183.0 lb

## 2024-05-03 DIAGNOSIS — R413 Other amnesia: Secondary | ICD-10-CM

## 2024-05-03 DIAGNOSIS — G8194 Hemiplegia, unspecified affecting left nondominant side: Secondary | ICD-10-CM

## 2024-05-03 DIAGNOSIS — S065X5S Traumatic subdural hemorrhage with loss of consciousness greater than 24 hours with return to pre-existing conscious level, sequela: Secondary | ICD-10-CM

## 2024-05-03 DIAGNOSIS — R269 Unspecified abnormalities of gait and mobility: Secondary | ICD-10-CM

## 2024-05-03 DIAGNOSIS — S069X9S Unspecified intracranial injury with loss of consciousness of unspecified duration, sequela: Secondary | ICD-10-CM

## 2024-05-03 DIAGNOSIS — M25662 Stiffness of left knee, not elsewhere classified: Secondary | ICD-10-CM
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
## 2024-05-03 NOTE — Patient Instructions (Addendum)
  TBI / cervical spinal cord injury --> resultant cognitive and gait difficulties - refer to neuropsychology consult and PM&R for evaluation (family concerned about long term ability for him to care for himself) - check EEG (rule out seizure disorder) - refer to hearing testing  POST-CONCUSSION HEADACHES - worsening headaches in 2021 (imm post-covid infx); now improved - continue OTC tylenol  / allegra as needed

## 2024-05-10 ENCOUNTER — Ambulatory Visit: Admitting: Diagnostic Neuroimaging

## 2024-05-10 DIAGNOSIS — R4182 Altered mental status, unspecified: Secondary | ICD-10-CM

## 2024-05-10 DIAGNOSIS — S065X5S Traumatic subdural hemorrhage with loss of consciousness greater than 24 hours with return to pre-existing conscious level, sequela: Secondary | ICD-10-CM

## 2024-05-10 DIAGNOSIS — R413 Other amnesia: Secondary | ICD-10-CM

## 2024-05-10 DIAGNOSIS — S069X9S Unspecified intracranial injury with loss of consciousness of unspecified duration, sequela: Secondary | ICD-10-CM

## 2024-05-25 ENCOUNTER — Ambulatory Visit: Payer: Self-pay | Admitting: Diagnostic Neuroimaging

## 2024-05-25 NOTE — Procedures (Signed)
   [facility]  EEG (ELECTROENCEPHALOGRAM) REPORT   STUDY DATE: 05/10/24 PATIENT NAME: Dakota Pena DOB: 06-Apr-1991 MRN: [id number]  ORDERING CLINICIAN: Eduard Hanlon, MD   TECHNOLOGIST: MARLA Plummer TECHNIQUE: Electroencephalogram was recorded utilizing standard 10-20 system of lead placement and reformatted into average and bipolar montages.  RECORDING TIME: 25 minutes ACTIVATION: hyperventilation and photic stimulation  CLINICAL INFORMATION: 33 year old male with TBI and memory changes.  FINDINGS: Posterior dominant background rhythms, which attenuate with eye opening, ranging 9-10 hertz and 15-20 microvolts. No focal, lateralizing, epileptiform activity or seizures are seen. Patient recorded in the awake and drowsy state. EKG channel shows regular rhythm of 55-60 beats per minute.   IMPRESSION:   Normal EEG in the awake and drowsy states.    INTERPRETING PHYSICIAN:  EDUARD FABIENE HANLON, MD Certified in Neurology, Neurophysiology and Neuroimaging  [facility] [address] [place], [place] [address] [phone]

## 2024-06-21 ENCOUNTER — Ambulatory Visit: Admitting: Internal Medicine

## 2024-07-01 ENCOUNTER — Ambulatory Visit: Admitting: Internal Medicine

## 2024-07-01 ENCOUNTER — Encounter: Payer: Self-pay | Admitting: Internal Medicine

## 2024-07-01 VITALS — BP 112/68 | HR 63 | Ht 75.0 in | Wt 182.8 lb

## 2024-07-01 DIAGNOSIS — R269 Unspecified abnormalities of gait and mobility: Secondary | ICD-10-CM

## 2024-07-01 DIAGNOSIS — I69954 Hemiplegia and hemiparesis following unspecified cerebrovascular disease affecting left non-dominant side: Secondary | ICD-10-CM

## 2024-07-01 DIAGNOSIS — Z23 Encounter for immunization: Secondary | ICD-10-CM | POA: Diagnosis not present

## 2024-07-01 DIAGNOSIS — R413 Other amnesia: Secondary | ICD-10-CM

## 2024-07-01 NOTE — Patient Instructions (Addendum)
 Please contact [phone] to schedule appointment for neuropsychological evaluation.  Please start taking Vitamin D  2000 IU once daily.

## 2024-07-01 NOTE — Assessment & Plan Note (Addendum)
 MoCA: 27/30 in 05/25 Has a history of postconcussion syndrome Recent MRI brain and EEG reviewed Had neurology evaluation -last visit note reviewed, referred to Neuropsychology, needs to schedule appointment
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
## 2024-07-01 NOTE — Assessment & Plan Note (Signed)
 Most likely due to h/o traumatic brain injury Has LLE weakness, improved with PT recently
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
## 2024-12-29 ENCOUNTER — Encounter: Admitting: Internal Medicine
